# Patient Record
Sex: Female | Born: 1980 | Race: Black or African American | Hispanic: No | Marital: Single | Smoking: Former smoker
Health system: Southern US, Community
[De-identification: ages and names within clinical notes are randomized; demographics above are authoritative.]

## PROBLEM LIST (undated history)

## (undated) ENCOUNTER — Inpatient Hospital Stay (HOSPITAL_COMMUNITY): Payer: Self-pay

## (undated) DIAGNOSIS — J45909 Unspecified asthma, uncomplicated: Secondary | ICD-10-CM

## (undated) DIAGNOSIS — B009 Herpesviral infection, unspecified: Secondary | ICD-10-CM

## (undated) DIAGNOSIS — J40 Bronchitis, not specified as acute or chronic: Secondary | ICD-10-CM

---

## 2011-06-10 ENCOUNTER — Emergency Department (HOSPITAL_BASED_OUTPATIENT_CLINIC_OR_DEPARTMENT_OTHER)
Admission: EM | Admit: 2011-06-10 | Discharge: 2011-06-10 | Disposition: A | Discharge status: Home / Self Care | Payer: Self-pay | Attending: Emergency Medicine | Admitting: Emergency Medicine

## 2011-06-10 ENCOUNTER — Encounter: Payer: Self-pay | Admitting: *Deleted

## 2011-06-10 DIAGNOSIS — F17200 Nicotine dependence, unspecified, uncomplicated: Secondary | ICD-10-CM | POA: Insufficient documentation

## 2011-06-10 DIAGNOSIS — IMO0001 Reserved for inherently not codable concepts without codable children: Secondary | ICD-10-CM | POA: Insufficient documentation

## 2011-06-10 NOTE — ED Provider Notes (Signed)
Medical screening examination/treatment/procedure(s) were performed by non-physician practitioner and as supervising physician I was immediately available for consultation/collaboration.   Lyanne Co, MD 06/10/11 2203

## 2011-06-10 NOTE — ED Notes (Signed)
Pt c/o right hand and arm ? Insect bite x 2 days

## 2011-06-10 NOTE — ED Provider Notes (Signed)
History     CSN: [id number] Arrival date & time: 06/10/2011  1:42 PM  Chief Complaint  Patient presents with  . Insect Bite   Patient is a 30 y.o. female presenting with rash. The history is provided by the patient.  Rash  This is a new problem. The current episode started yesterday. The problem has not changed since onset.The problem is associated with an insect bite/sting. There has been no fever. The rash is present on the right arm. The pain is at a severity of 0/10. The patient is experiencing no pain. Associated symptoms include itching. She has tried nothing for the symptoms. Risk factors include new environmental exposures.   Pt complains of itching and bug bites on her left arm. History reviewed. No pertinent past medical history.  History reviewed. No pertinent past surgical history.  History reviewed. No pertinent family history.  History  Substance Use Topics  . Smoking status: Current Everyday Smoker -- 0.5 packs/day  . Smokeless tobacco: Not on file  . Alcohol Use: No    OB History    Grav Para Term Preterm Abortions TAB SAB Ect Mult Living                  Review of Systems  Skin: Positive for itching and rash.  All other systems reviewed and are negative.    Physical Exam  BP 104/55  Pulse 63  Temp(Src) 98 F (36.7 C) (Oral)  Resp 16  Wt 185 lb (83.915 kg)  LMP 06/03/2011  Physical Exam  Nursing note and vitals reviewed. Constitutional: She appears well-developed and well-nourished.  HENT:  Head: Normocephalic.  Eyes: Conjunctivae are normal. Pupils are equal, round, and reactive to light.  Neck: Normal range of motion. Neck supple.  Cardiovascular: Normal rate.   Pulmonary/Chest: Effort normal.  Abdominal: Soft.  Musculoskeletal: Normal range of motion.  Neurological: She is alert.  Skin: Rash noted.  Psychiatric: She has a normal mood and affect.   Pt has 2 small insect bites on her right arm ED Course  Procedures  MDM        Langston Masker, [place] 06/10/11 1410

## 2015-10-22 DIAGNOSIS — A6009 Herpesviral infection of other urogenital tract: Secondary | ICD-10-CM

## 2015-11-02 ENCOUNTER — Emergency Department (HOSPITAL_BASED_OUTPATIENT_CLINIC_OR_DEPARTMENT_OTHER)
Admission: EM | Admit: 2015-11-02 | Discharge: 2015-11-02 | Disposition: A | Discharge status: Home / Self Care | Payer: Medicaid Other | Attending: Emergency Medicine | Admitting: Emergency Medicine

## 2015-11-02 ENCOUNTER — Encounter (HOSPITAL_BASED_OUTPATIENT_CLINIC_OR_DEPARTMENT_OTHER): Payer: Self-pay | Admitting: *Deleted

## 2015-11-02 ENCOUNTER — Emergency Department (HOSPITAL_BASED_OUTPATIENT_CLINIC_OR_DEPARTMENT_OTHER): Payer: Medicaid Other

## 2015-11-02 DIAGNOSIS — R197 Diarrhea, unspecified: Secondary | ICD-10-CM | POA: Insufficient documentation

## 2015-11-02 DIAGNOSIS — Z8709 Personal history of other diseases of the respiratory system: Secondary | ICD-10-CM | POA: Diagnosis not present

## 2015-11-02 DIAGNOSIS — R55 Syncope and collapse: Secondary | ICD-10-CM | POA: Diagnosis not present

## 2015-11-02 DIAGNOSIS — R112 Nausea with vomiting, unspecified: Secondary | ICD-10-CM

## 2015-11-02 DIAGNOSIS — F17200 Nicotine dependence, unspecified, uncomplicated: Secondary | ICD-10-CM | POA: Diagnosis not present

## 2015-11-02 DIAGNOSIS — Z3202 Encounter for pregnancy test, result negative: Secondary | ICD-10-CM | POA: Diagnosis not present

## 2015-11-02 DIAGNOSIS — B349 Viral infection, unspecified: Secondary | ICD-10-CM | POA: Diagnosis not present

## 2015-11-02 HISTORY — DX: Bronchitis, not specified as acute or chronic: J40

## 2015-11-02 LAB — CBC WITH DIFFERENTIAL/PLATELET
BASOS ABS: 0 10*3/uL (ref 0.0–0.1)
BASOS PCT: 0 %
EOS ABS: 0 10*3/uL (ref 0.0–0.7)
Eosinophils Relative: 0 %
HCT: 40.5 % (ref 36.0–46.0)
HEMOGLOBIN: 13.1 g/dL (ref 12.0–15.0)
Lymphocytes Relative: 7 %
Lymphs Abs: 0.9 10*3/uL (ref 0.7–4.0)
MCH: 29.5 pg (ref 26.0–34.0)
MCHC: 32.3 g/dL (ref 30.0–36.0)
MCV: 91.2 fL (ref 78.0–100.0)
Monocytes Absolute: 0.8 10*3/uL (ref 0.1–1.0)
Monocytes Relative: 7 %
NEUTROS ABS: 10.1 10*3/uL — AB (ref 1.7–7.7)
NEUTROS PCT: 86 %
Platelets: 327 10*3/uL (ref 150–400)
RBC: 4.44 MIL/uL (ref 3.87–5.11)
RDW: 12 % (ref 11.5–15.5)
WBC: 11.8 10*3/uL — AB (ref 4.0–10.5)

## 2015-11-02 LAB — URINALYSIS, ROUTINE W REFLEX MICROSCOPIC
GLUCOSE, UA: NEGATIVE mg/dL
Hgb urine dipstick: NEGATIVE
KETONES UR: 15 mg/dL — AB
NITRITE: NEGATIVE
PH: 5.5 (ref 5.0–8.0)
PROTEIN: NEGATIVE mg/dL
Specific Gravity, Urine: 1.029 (ref 1.005–1.030)

## 2015-11-02 LAB — URINE MICROSCOPIC-ADD ON: RBC / HPF: NONE SEEN RBC/hpf (ref 0–5)

## 2015-11-02 LAB — COMPREHENSIVE METABOLIC PANEL
ALK PHOS: 56 U/L (ref 38–126)
ALT: 9 U/L — ABNORMAL LOW (ref 14–54)
ANION GAP: 7 (ref 5–15)
AST: 13 U/L — ABNORMAL LOW (ref 15–41)
Albumin: 4 g/dL (ref 3.5–5.0)
BILIRUBIN TOTAL: 0.9 mg/dL (ref 0.3–1.2)
BUN: 11 mg/dL (ref 6–20)
CALCIUM: 9.1 mg/dL (ref 8.9–10.3)
CO2: 26 mmol/L (ref 22–32)
Chloride: 105 mmol/L (ref 101–111)
Creatinine, Ser: 0.79 mg/dL (ref 0.44–1.00)
GFR calc non Af Amer: >60 mL/min (ref >60)
Glucose, Bld: 91 mg/dL (ref 65–99)
POTASSIUM: 3.9 mmol/L (ref 3.5–5.1)
Sodium: 138 mmol/L (ref 135–145)
TOTAL PROTEIN: 8.4 g/dL — AB (ref 6.5–8.1)

## 2015-11-02 LAB — LIPASE, BLOOD: LIPASE: 17 U/L (ref 11–51)

## 2015-11-02 LAB — PREGNANCY, URINE: Preg Test, Ur: NEGATIVE

## 2015-11-02 MED ORDER — ONDANSETRON HCL 4 MG/2ML IJ SOLN
4.0000 mg | Freq: Once | INTRAMUSCULAR | Status: AC
  Administered 2015-11-02: 4 mg via INTRAVENOUS
  Filled 2015-11-02: qty 2

## 2015-11-02 MED ORDER — SODIUM CHLORIDE 0.9 % IV BOLUS (SEPSIS)
1000.0000 mL | Freq: Once | INTRAVENOUS | Status: AC
  Administered 2015-11-02: 1000 mL via INTRAVENOUS

## 2015-11-02 MED ORDER — ONDANSETRON 4 MG PO TBDP: 4.0000 mg | ORAL_TABLET | Freq: Three times a day (TID) | ORAL | Status: DC | PRN

## 2015-11-02 NOTE — ED Notes (Signed)
PA at bedside.

## 2015-11-02 NOTE — ED Notes (Signed)
Pt states she was here with children a week or so ago and they were diagnosed with URI. Today had near syncopal episode at work. Diarrhea onset this a.m x 2 and vomited x 3.. Last time at 1300. Also c/o cough. Hurts to cough. Abd discomfort after having dry heaves. Denies urinary s/s. Clr vag d/c.

## 2015-11-02 NOTE — ED Notes (Signed)
Reviewed d/c instructions with pt. Verbalizes understanding. No questions. Accompanied to d/c window.

## 2015-11-02 NOTE — ED Notes (Signed)
EMS VS BP 120/80 P 88 SPO2 98% R 16

## 2015-11-02 NOTE — ED Notes (Signed)
Patient transported to and from radiology department via stretcher. 

## 2015-11-02 NOTE — ED Provider Notes (Signed)
CSN: [id number]     Arrival date & time 11/02/15  1512 History   First MD Initiated Contact with Patient 11/02/15 1516     Chief Complaint  Patient presents with  . Nausea    Pt reports vomiting X 3 today    HPI  Ms. Dorise BullionGladden is an 35 y.o. female with no significant PMH who presents to the ED for evaluation of nausea, vomiting, and diarrhea with a pre-syncopal episode. She states that last week her daughter was sick with a URI and since then she has had a mild intermittent cough. However starting this AM pt reports she has felt nauseated and fatigued. She reports three episodes of NBNB emesis and 2 episodes of watery diarrhea. 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 She states she did not lose consciousness completely. In the ED now she denies headache, chest pain, SOB, dizziness. She states she feels nauseated but has had no further episodes of emesis. She is unsure if she has had a fever at home. Denies recent travel.   Past Medical History  Diagnosis Date  . Bronchitis    History reviewed. No pertinent past surgical history. No family history on file. Social History  Substance Use Topics  . Smoking status: Current Every Day Smoker -- 0.50 packs/day  . Smokeless tobacco: None  . Alcohol Use: No   OB History    No data available     Review of Systems  All other systems reviewed and are negative.     Allergies  Review of patient's allergies indicates no known allergies.  Home Medications   Prior to Admission medications   Not on File   BP 116/71 mmHg  Pulse 86  Temp(Src) 99 F (37.2 C) (Oral)  Resp 18  Ht 5' (1.524 m)  Wt 81.647 kg  BMI 35.15 kg/m2  SpO2 100%  LMP 10/02/2015 Physical Exam  Constitutional: She is oriented to person, place, and time. No distress.  HENT:  Right Ear: External ear normal.  Left Ear: External ear normal.  Nose: Nose  normal.  Mouth/Throat: Oropharynx is clear and moist. No oropharyngeal exudate.  Eyes: Conjunctivae and EOM are normal. Pupils are equal, round, and reactive to light.  Neck: Normal range of motion. Neck supple.  Cardiovascular: Normal rate, regular rhythm, normal heart sounds and intact distal pulses.   Pulmonary/Chest: Effort normal and breath sounds normal. No respiratory distress. She has no wheezes. She exhibits no tenderness.  Abdominal: Soft. Bowel sounds are normal. She exhibits no distension. There is no tenderness. There is no rebound and no guarding.  Musculoskeletal: She exhibits no edema.  Neurological: She is alert and oriented to person, place, and time. No cranial nerve deficit.  Skin: Skin is warm and dry. She is not diaphoretic.  Psychiatric: She has a normal mood and affect.  Nursing note and vitals reviewed.   Orthostatic VS for the past 24 hrs:  BP- Lying Pulse- Lying BP- Sitting Pulse- Sitting BP- Standing at 0 minutes Pulse- Standing at 0 minutes  11/02/15 1623 112/69 mmHg 72 120/82 mmHg 72 104/72 mmHg 99      ED Course  Procedures (including critical care time) Labs Review Labs Reviewed  URINALYSIS, ROUTINE W REFLEX MICROSCOPIC (NOT AT [facility]) - Abnormal; Notable for the following:    Color, Urine AMBER (*)    Bilirubin Urine SMALL (*)  Ketones, ur 15 (*)    Leukocytes, UA TRACE (*)    All other components within normal limits  COMPREHENSIVE METABOLIC PANEL - Abnormal; Notable for the following:    Total Protein 8.4 (*)    AST 13 (*)    ALT 9 (*)    All other components within normal limits  CBC WITH DIFFERENTIAL/PLATELET - Abnormal; Notable for the following:    WBC 11.8 (*)    Neutro Abs 10.1 (*)    All other components within normal limits  URINE MICROSCOPIC-ADD ON - Abnormal; Notable for the following:    Squamous Epithelial / LPF 0-5 (*)    Bacteria, UA RARE (*)    All other components within normal limits  PREGNANCY, URINE  LIPASE, BLOOD     Imaging Review Dg Chest 2 View  11/02/2015  CLINICAL DATA:  Syncope and cough for 1 day. Epigastric pain, nausea and vomiting. Initial encounter. EXAM: CHEST  2 VIEW COMPARISON:  None. FINDINGS: The lungs are clear. Heart size is normal. No pneumothorax or pleural fluid. No bony abnormality. IMPRESSION: Normal chest. Electronically Signed   By: Drusilla Kanner M.D.   On: 11/02/2015 16:11   I have personally reviewed and evaluated these images and lab results as part of my medical decision-making.   EKG Interpretation   Date/Time:  Thursday November 02 2015 16:11:02 EST Ventricular Rate:  72 PR Interval:  145 QRS Duration: 82 QT Interval:  365 QTC Calculation: 399 R Axis:   78 Text Interpretation:  Sinus rhythm Borderline T abnormalities, anterior  leads ED PHYSICIAN INTERPRETATION AVAILABLE IN [facility] Confirmed  by TEST, Record ([id number]) on 11/03/2015 6:46:27 AM      MDM   Final diagnoses:  Non-intractable vomiting with nausea, vomiting of unspecified type  Viral syndrome    Suspect pt has viral syndrome with pre-syncopal episode due to orthostasis and dehydration. Her UA does show trace leuks but she denies urinary symptoms so will just send for culture for now. CXR negative. WBC 11.8. Pregnancy negative. Pt feels improved with 1L NS bolus and zofran in the ED. Will give rx for zofran at home and pt instructed to drink plenty of fluids to stay hydrated. Resource guide given for PCP follow up. ER return precautions given.    Carlene Coria, PA-C 11/03/15 1824  Nelva Nay, MD 11/08/15 [phone]

## 2015-11-02 NOTE — Discharge Instructions (Signed)
Your labs today were normal. Your symptoms are likely due to a virus. I will give you a prescription for medicine to help with your nausea. Drink plenty of fluids. Stick to a simple diet until your symptoms improve. Please follow up with your primary care provider. If you do not have one you may contact one on the list we give you with this packet.

## 2016-02-17 ENCOUNTER — Encounter (HOSPITAL_BASED_OUTPATIENT_CLINIC_OR_DEPARTMENT_OTHER): Payer: Self-pay | Admitting: *Deleted

## 2016-02-17 ENCOUNTER — Emergency Department (HOSPITAL_BASED_OUTPATIENT_CLINIC_OR_DEPARTMENT_OTHER)
Admission: EM | Admit: 2016-02-17 | Discharge: 2016-02-17 | Disposition: A | Discharge status: Home / Self Care | Payer: Medicaid Other | Attending: Emergency Medicine | Admitting: Emergency Medicine

## 2016-02-17 DIAGNOSIS — O2341 Unspecified infection of urinary tract in pregnancy, first trimester: Secondary | ICD-10-CM | POA: Diagnosis not present

## 2016-02-17 DIAGNOSIS — Z87891 Personal history of nicotine dependence: Secondary | ICD-10-CM | POA: Diagnosis not present

## 2016-02-17 DIAGNOSIS — Z3A10 10 weeks gestation of pregnancy: Secondary | ICD-10-CM | POA: Insufficient documentation

## 2016-02-17 DIAGNOSIS — N390 Urinary tract infection, site not specified: Secondary | ICD-10-CM

## 2016-02-17 DIAGNOSIS — O210 Mild hyperemesis gravidarum: Secondary | ICD-10-CM | POA: Diagnosis present

## 2016-02-17 DIAGNOSIS — O219 Vomiting of pregnancy, unspecified: Secondary | ICD-10-CM

## 2016-02-17 LAB — URINALYSIS, ROUTINE W REFLEX MICROSCOPIC
Glucose, UA: NEGATIVE mg/dL
Hgb urine dipstick: NEGATIVE
Ketones, ur: >80 mg/dL — AB
NITRITE: NEGATIVE
PROTEIN: 30 mg/dL — AB
SPECIFIC GRAVITY, URINE: 1.036 — AB (ref 1.005–1.030)
pH: 6.5 (ref 5.0–8.0)

## 2016-02-17 LAB — URINE MICROSCOPIC-ADD ON

## 2016-02-17 LAB — PREGNANCY, URINE: PREG TEST UR: POSITIVE — AB

## 2016-02-17 MED ORDER — ONDANSETRON 4 MG PO TBDP
4.0000 mg | ORAL_TABLET | Freq: Once | ORAL | Status: AC
  Administered 2016-02-17: 4 mg via ORAL

## 2016-02-17 MED ORDER — ONDANSETRON 4 MG PO TBDP
ORAL_TABLET | ORAL | Status: AC
  Filled 2016-02-17: qty 1

## 2016-02-17 MED ORDER — CEPHALEXIN 500 MG PO CAPS: 500.0000 mg | ORAL_CAPSULE | Freq: Two times a day (BID) | ORAL | Status: DC

## 2016-02-17 MED ORDER — METOCLOPRAMIDE HCL 10 MG PO TABS: 10.0000 mg | ORAL_TABLET | Freq: Three times a day (TID) | ORAL | Status: DC | PRN

## 2016-02-17 NOTE — Discharge Instructions (Signed)
Start taking prenatal vitamins, available over the counter.     Urinary Tract Infection Urinary tract infections (UTIs) can develop anywhere along your urinary tract. Your urinary tract is your body's drainage system for removing wastes and extra water. Your urinary tract includes two kidneys, two ureters, a bladder, and a urethra. Your kidneys are a pair of bean-shaped organs. Each kidney is about the size of your fist. They are located below your ribs, one on each side of your spine. CAUSES Infections are caused by microbes, which are microscopic organisms, including fungi, viruses, and bacteria. These organisms are so small that they can only be seen through a microscope. Bacteria are the microbes that most commonly cause UTIs. SYMPTOMS  Symptoms of UTIs may vary by age and gender of the patient and by the location of the infection. Symptoms in young women typically include a frequent and intense urge to urinate and a painful, burning feeling in the bladder or urethra during urination. Older women and men are more likely to be tired, shaky, and weak and have muscle aches and abdominal pain. A fever may mean the infection is in your kidneys. Other symptoms of a kidney infection include pain in your back or sides below the ribs, nausea, and vomiting. DIAGNOSIS To diagnose a UTI, your caregiver will ask you about your symptoms. Your caregiver will also ask you to provide a urine sample. The urine sample will be tested for bacteria and white blood cells. White blood cells are made by your body to help fight infection. TREATMENT  Typically, UTIs can be treated with medication. Because most UTIs are caused by a bacterial infection, they usually can be treated with the use of antibiotics. The choice of antibiotic and length of treatment depend on your symptoms and the type of bacteria causing your infection. HOME CARE INSTRUCTIONS  If you were prescribed antibiotics, take them exactly as your caregiver  instructs you. Finish the medication even if you feel better after you have only taken some of the medication.  Drink enough water and fluids to keep your urine clear or pale yellow.  Avoid caffeine, tea, and carbonated beverages. They tend to irritate your bladder.  Empty your bladder often. Avoid holding urine for long periods of time.  Empty your bladder before and after sexual intercourse.  After a bowel movement, women should cleanse from front to back. Use each tissue only once. SEEK MEDICAL CARE IF:   You have back pain.  You develop a fever.  Your symptoms do not begin to resolve within 3 days. SEEK IMMEDIATE MEDICAL CARE IF:   You have severe back pain or lower abdominal pain.  You develop chills.  You have nausea or vomiting.  You have continued burning or discomfort with urination. MAKE SURE YOU:   Understand these instructions.  Will watch your condition.  Will get help right away if you are not doing well or get worse.   This information is not intended to replace advice given to you by your health care provider. Make sure you discuss any questions you have with your health care provider.   Document Released: 07/17/2005 Document Revised: 06/28/2015 Document Reviewed: 11/15/2011 Elsevier Interactive Patient Education 2016 [employer]. First Trimester of Pregnancy The first trimester of pregnancy is from week 1 until the end of week 12 (months 1 through 3). A week after a sperm fertilizes an egg, the egg will implant on the wall of the uterus. This embryo will begin to develop into a  baby. Genes from you and your partner are forming the baby. The female genes determine whether the baby is a boy or a girl. At 6-8 weeks, the eyes and face are formed, and the heartbeat can be seen on ultrasound. At the end of 12 weeks, all the baby's organs are formed.  Now that you are pregnant, you will want to do everything you can to have a healthy baby. Two of the most important  things are to get good prenatal care and to follow your health care provider's instructions. Prenatal care is all the medical care you receive before the baby's birth. This care will help prevent, find, and treat any problems during the pregnancy and childbirth. BODY CHANGES Your body goes through many changes during pregnancy. The changes vary from woman to woman.   You may gain or lose a couple of pounds at first.  You may feel sick to your stomach (nauseous) and throw up (vomit). If the vomiting is uncontrollable, call your health care provider.  You may tire easily.  You may develop headaches that can be relieved by medicines approved by your health care provider.  You may urinate more often. Painful urination may mean you have a bladder infection.  You may develop heartburn as a result of your pregnancy.  You may develop constipation because certain hormones are causing the muscles that push waste through your intestines to slow down.  You may develop hemorrhoids or swollen, bulging veins (varicose veins).  Your breasts may begin to grow larger and become tender. Your nipples may stick out more, and the tissue that surrounds them (areola) may become darker.  Your gums may bleed and may be sensitive to brushing and flossing.  Dark spots or blotches (chloasma, mask of pregnancy) may develop on your face. This will likely fade after the baby is born.  Your menstrual periods will stop.  You may have a loss of appetite.  You may develop cravings for certain kinds of food.  You may have changes in your emotions from day to day, such as being excited to be pregnant or being concerned that something may go wrong with the pregnancy and baby.  You may have more vivid and strange dreams.  You may have changes in your hair. These can include thickening of your hair, rapid growth, and changes in texture. Some women also have hair loss during or after pregnancy, or hair that feels dry or  thin. Your hair will most likely return to normal after your baby is born. WHAT TO EXPECT AT YOUR PRENATAL VISITS During a routine prenatal visit:  You will be weighed to make sure you and the baby are growing normally.  Your blood pressure will be taken.  Your abdomen will be measured to track your baby's growth.  The fetal heartbeat will be listened to starting around week 10 or 12 of your pregnancy.  Test results from any previous visits will be discussed. Your health care provider may ask you:  How you are feeling.  If you are feeling the baby move.  If you have had any abnormal symptoms, such as leaking fluid, bleeding, severe headaches, or abdominal cramping.  If you are using any tobacco products, including cigarettes, chewing tobacco, and electronic cigarettes.  If you have any questions. Other tests that may be performed during your first trimester include:  Blood tests to find your blood type and to check for the presence of any previous infections. They will also be used to  check for low iron levels (anemia) and Rh antibodies. Later in the pregnancy, blood tests for diabetes will be done along with other tests if problems develop.  Urine tests to check for infections, diabetes, or protein in the urine.  An ultrasound to confirm the proper growth and development of the baby.  An amniocentesis to check for possible genetic problems.  Fetal screens for spina bifida and Down syndrome.  You may need other tests to make sure you and the baby are doing well.  HIV (human immunodeficiency virus) testing. Routine prenatal testing includes screening for HIV, unless you choose not to have this test. HOME CARE INSTRUCTIONS  Medicines  Follow your health care provider's instructions regarding medicine use. Specific medicines may be either safe or unsafe to take during pregnancy.  Take your prenatal vitamins as directed.  If you develop constipation, try taking a stool  softener if your health care provider approves. Diet  Eat regular, well-balanced meals. Choose a variety of foods, such as meat or vegetable-based protein, fish, milk and low-fat dairy products, vegetables, fruits, and whole grain breads and cereals. Your health care provider will help you determine the amount of weight gain that is right for you.  Avoid raw meat and uncooked cheese. These carry germs that can cause birth defects in the baby.  Eating four or five small meals rather than three large meals a day may help relieve nausea and vomiting. If you start to feel nauseous, eating a few soda crackers can be helpful. Drinking liquids between meals instead of during meals also seems to help nausea and vomiting.  If you develop constipation, eat more high-fiber foods, such as fresh vegetables or fruit and whole grains. Drink enough fluids to keep your urine clear or pale yellow. Activity and Exercise  Exercise only as directed by your health care provider. Exercising will help you:  Control your weight.  Stay in shape.  Be prepared for labor and delivery.  Experiencing pain or cramping in the lower abdomen or low back is a good sign that you should stop exercising. Check with your health care provider before continuing normal exercises.  Try to avoid standing for long periods of time. Move your legs often if you must stand in one place for a long time.  Avoid heavy lifting.  Wear low-heeled shoes, and practice good posture.  You may continue to have sex unless your health care provider directs you otherwise. Relief of Pain or Discomfort  Wear a good support bra for breast tenderness.   Take warm sitz baths to soothe any pain or discomfort caused by hemorrhoids. Use hemorrhoid cream if your health care provider approves.   Rest with your legs elevated if you have leg cramps or low back pain.  If you develop varicose veins in your legs, wear support hose. Elevate your feet for  15 minutes, 3-4 times a day. Limit salt in your diet. Prenatal Care  Schedule your prenatal visits by the twelfth week of pregnancy. They are usually scheduled monthly at first, then more often in the last 2 months before delivery.  Write down your questions. Take them to your prenatal visits.  Keep all your prenatal visits as directed by your health care provider. Safety  Wear your seat belt at all times when driving.  Make a list of emergency phone numbers, including numbers for family, friends, the hospital, and police and fire departments. General Tips  Ask your health care provider for a referral to a local  prenatal education class. Begin classes no later than at the beginning of month 6 of your pregnancy.  Ask for help if you have counseling or nutritional needs during pregnancy. Your health care provider can offer advice or refer you to specialists for help with various needs.  Do not use hot tubs, steam rooms, or saunas.  Do not douche or use tampons or scented sanitary pads.  Do not cross your legs for long periods of time.  Avoid cat litter boxes and soil used by cats. These carry germs that can cause birth defects in the baby and possibly loss of the fetus by miscarriage or stillbirth.  Avoid all smoking, herbs, alcohol, and medicines not prescribed by your health care provider. Chemicals in these affect the formation and growth of the baby.  Do not use any tobacco products, including cigarettes, chewing tobacco, and electronic cigarettes. If you need help quitting, ask your health care provider. You may receive counseling support and other resources to help you quit.  Schedule a dentist appointment. At home, brush your teeth with a soft toothbrush and be gentle when you floss. SEEK MEDICAL CARE IF:   You have dizziness.  You have mild pelvic cramps, pelvic pressure, or nagging pain in the abdominal area.  You have persistent nausea, vomiting, or diarrhea.  You have  a bad smelling vaginal discharge.  You have pain with urination.  You notice increased swelling in your face, hands, legs, or ankles. SEEK IMMEDIATE MEDICAL CARE IF:   You have a fever.  You are leaking fluid from your vagina.  You have spotting or bleeding from your vagina.  You have severe abdominal cramping or pain.  You have rapid weight gain or loss.  You vomit blood or material that looks like coffee grounds.  You are exposed to [place] measles and have never had them.  You are exposed to fifth disease or chickenpox.  You develop a severe headache.  You have shortness of breath.  You have any kind of trauma, such as from a fall or a car accident.   This information is not intended to replace advice given to you by your health care provider. Make sure you discuss any questions you have with your health care provider.   Document Released: 10/01/2001 Document Revised: 10/28/2014 Document Reviewed: 08/17/2013 Elsevier Interactive Patient Education [employer]

## 2016-02-17 NOTE — ED Provider Notes (Signed)
CSN: [id number]     Arrival date & time 02/17/16  1319 History  By signing my name below, I, Bethel Born, attest that this documentation has been prepared under the direction and in the presence of Tilden Fossa, MD. Electronically Signed: Bethel Born, ED Scribe. 02/17/2016. 3:14 PM   Chief Complaint  Patient presents with  . Emesis    The history is provided by the patient. No language interpreter was used.   Cassandra Wade is a 35 y.o. female who presents to the Emergency Department complaining of nausea and vomiting with onset 3 days ago. Pt states that her appetite has decreased and she has been having daily nausea and yellow emesis. The episodes of emesis increased last night. She has been able to hold down fluids. Associated symptoms include clear vaginal discharge. Pt is concerned that she may be pregnant. LNMP was in February of this year. G2P2.  Pt denies fever, abdominal pain, and diarrhea. She is otherwise healthy and takes no daily medication. NKDA.   Past Medical History  Diagnosis Date  . Bronchitis    History reviewed. No pertinent past surgical history. No family history on file. Social History  Substance Use Topics  . Smoking status: Former Smoker -- 0.50 packs/day  . Smokeless tobacco: Never Used  . Alcohol Use: No   OB History    No data available     Review of Systems  Constitutional: Positive for appetite change. Negative for fever.  Gastrointestinal: Positive for nausea and vomiting. Negative for abdominal pain and diarrhea.  All other systems reviewed and are negative.   Allergies  Review of patient's allergies indicates no known allergies.  Home Medications   Prior to Admission medications   Medication Sig Start Date End Date Taking? Authorizing Provider  cephALEXin (KEFLEX) 500 MG capsule Take 1 capsule (500 mg total) by mouth 2 (two) times daily. 02/17/16   Tilden Fossa, MD  metoCLOPramide (REGLAN) 10 MG tablet Take 1 tablet (10 mg  total) by mouth every 8 (eight) hours as needed for nausea. 02/17/16   Tilden Fossa, MD  ondansetron (ZOFRAN ODT) 4 MG disintegrating tablet Take 1 tablet (4 mg total) by mouth every 8 (eight) hours as needed for nausea or vomiting. 11/02/15   Ace Gins Sam, PA-C   BP 111/65 mmHg  Pulse 66  Temp(Src) 98.2 F (36.8 C) (Oral)  Resp 20  Ht  (1.6 m)  Wt 144 lb (65.318 kg)  BMI 25.51 kg/m2  SpO2 100%  LMP 12/15/2015 (Approximate) Physical Exam  Constitutional: She is oriented to person, place, and time. She appears well-developed and well-nourished.  HENT:  Head: Normocephalic and atraumatic.  Cardiovascular: Normal rate and regular rhythm.   No murmur heard. Pulmonary/Chest: Effort normal and breath sounds normal. No respiratory distress.  Abdominal: Soft. There is no tenderness. There is no rebound and no guarding.  Musculoskeletal: She exhibits no edema or tenderness.  Neurological: She is alert and oriented to person, place, and time.  Skin: Skin is warm and dry.  Psychiatric: She has a normal mood and affect. Her behavior is normal.  Nursing note and vitals reviewed.   ED Course  Procedures  EMERGENCY DEPARTMENT [place] PREGNANCY "Study: Limited Ultrasound of the Pelvis"  INDICATIONS:Teaching study Multiple views of the uterus and pelvic cavity are obtained with a multi-frequency probe.  APPROACH:Transabdominal   PERFORMED BY: Myself  IMAGES ARCHIVED?: Yes  LIMITATIONS: Decompressed bladder  PREGNANCY UTERUS FINDINGS:Gestational sac noted   PREGNANCY FINDINGS: Fetal heart activity  seen  INTERPRETATION: Viable intrauterine pregnancy   FETAL HEART RATE: 167     DIAGNOSTIC STUDIES: Oxygen Saturation is 100% on RA,  normal by my interpretation.    COORDINATION OF CARE: 3:10 PM Discussed treatment plan which includes lab work and Zofran with pt at bedside and pt agreed to plan.  Labs Review Labs Reviewed  URINALYSIS, ROUTINE W REFLEX MICROSCOPIC (NOT [facility]) - Abnormal; Notable for the following:    Color, Urine AMBER (*)    APPearance CLOUDY (*)    Specific Gravity, Urine 1.036 (*)    Bilirubin Urine MODERATE (*)    Ketones, ur >80 (*)    Protein, ur 30 (*)    Leukocytes, UA MODERATE (*)    All other components within normal limits  PREGNANCY, URINE - Abnormal; Notable for the following:    Preg Test, Ur POSITIVE (*)    All other components within normal limits  URINE MICROSCOPIC-ADD ON - Abnormal; Notable for the following:    Squamous Epithelial / LPF 6-30 (*)    Bacteria, UA MANY (*)    All other components within normal limits    Imaging Review No results found. I have personally reviewed and evaluated these lab results as part of my medical decision-making.   EKG Interpretation None      MDM   Final diagnoses:  Nausea/vomiting in pregnancy  Acute UTI (urinary tract infection)   Pt here with vomiting, concerned she may be pregnant.  Bedside US with cardiac activity, IUP.  UA is c/w UTI.  Pt tolerating oral fluids in the department.  Discussed home care for first trimester pregnancy, UTI, vomiting.  Discussed outpatient follow up and return precautions.    I personally performed the services described in this documentation, which was scribed in my presence. The recorded information has been reviewed and is accurate.    Tilden FossaElizabeth Pallavi Clifton, MD 02/17/16 1743

## 2016-02-17 NOTE — ED Notes (Signed)
N/v since wednesday

## 2016-02-17 NOTE — ED Notes (Signed)
Discharged in error

## 2016-03-22 ENCOUNTER — Emergency Department (HOSPITAL_BASED_OUTPATIENT_CLINIC_OR_DEPARTMENT_OTHER)
Admission: EM | Admit: 2016-03-22 | Discharge: 2016-03-23 | Disposition: A | Discharge status: Home / Self Care | Payer: Medicaid Other | Attending: Emergency Medicine | Admitting: Emergency Medicine

## 2016-03-22 ENCOUNTER — Encounter (HOSPITAL_BASED_OUTPATIENT_CLINIC_OR_DEPARTMENT_OTHER): Payer: Self-pay

## 2016-03-22 DIAGNOSIS — Z3A14 14 weeks gestation of pregnancy: Secondary | ICD-10-CM | POA: Diagnosis not present

## 2016-03-22 DIAGNOSIS — Z87891 Personal history of nicotine dependence: Secondary | ICD-10-CM | POA: Insufficient documentation

## 2016-03-22 DIAGNOSIS — O210 Mild hyperemesis gravidarum: Secondary | ICD-10-CM | POA: Diagnosis not present

## 2016-03-22 DIAGNOSIS — O219 Vomiting of pregnancy, unspecified: Secondary | ICD-10-CM

## 2016-03-22 MED ORDER — METOCLOPRAMIDE HCL 10 MG PO TABS
10.0000 mg | ORAL_TABLET | Freq: Once | ORAL | Status: AC
  Administered 2016-03-22: 10 mg via ORAL
  Filled 2016-03-22: qty 1

## 2016-03-22 NOTE — ED Notes (Signed)
Pt is pregnant and c/o n/v "for a while," states it's been going on "all day, non stop."  Pt in no acute distress.  Pt has several medications at home for n/v but states they are not working.

## 2016-03-22 NOTE — ED Provider Notes (Signed)
CSN: [id number]     Arrival date & time 03/22/16  2204 History  By signing my name below, I, Bridgette HabermannMaria Tan, attest that this documentation has been prepared under the direction and in the presence of Tomasita CrumbleAdeleke Linsey Arteaga, MD. Electronically Signed: Bridgette HabermannMaria Tan, ED Scribe. 03/22/2016. 11:25 PM.   Chief Complaint  Patient presents with  . Emesis During Pregnancy    The history is provided by the patient. No language interpreter was used.    HPI Comments: Cassandra Wade is a 35 y.o. female who presents to the Emergency Department complaining of nausea and vomiting onset a couple of weeks. Pt is currently pregnant. Patient also has abdominal pain secondary to vomiting. Patient states that she has had these symptoms in her past pregnancy but not this bad. Patient has been taking Diclegis, Phenergan, and Pepcid with no relief. Patient denies fever, diarrhea, diaphoresis, vaginal bleeding, fluid leakage. Pt saw her OBGYN on 05/30 (approximately 3 days ago) for her symptoms and was prescribed the medication above.   Past Medical History  Diagnosis Date  . Bronchitis    History reviewed. No pertinent past surgical history. No family history on file. Social History  Substance Use Topics  . Smoking status: Former Smoker -- 0.50 packs/day  . Smokeless tobacco: Never Used  . Alcohol Use: No   OB History    Gravida Para Term Preterm AB TAB SAB Ectopic Multiple Living   1              Review of Systems  10 Systems reviewed and are negative for acute change except as noted in the HPI.  Allergies  Review of patient's allergies indicates no known allergies.  Home Medications   Prior to Admission medications   Medication Sig Start Date End Date Taking? Authorizing Provider  cephALEXin (KEFLEX) 500 MG capsule Take 1 capsule (500 mg total) by mouth 2 (two) times daily. 02/17/16   Tilden FossaElizabeth Rees, MD  metoCLOPramide (REGLAN) 10 MG tablet Take 1 tablet (10 mg total) by mouth every 8 (eight) hours as needed for  nausea. 02/17/16   Tilden FossaElizabeth Rees, MD  ondansetron (ZOFRAN ODT) 4 MG disintegrating tablet Take 1 tablet (4 mg total) by mouth every 8 (eight) hours as needed for nausea or vomiting. 11/02/15   Ace GinsSerena Y Sam, PA-C   BP 120/57 mmHg  Pulse 78  Temp(Src) 98.6 F (37 C) (Oral)  Resp 20  Ht 5' (1.524 m)  Wt 152 lb (68.947 kg)  BMI 29.69 kg/m2  SpO2 100%  LMP 12/15/2015 (Approximate) Physical Exam  Constitutional: She is oriented to person, place, and time. She appears well-developed and well-nourished. No distress.  HENT:  Head: Normocephalic and atraumatic.  Nose: Nose normal.  Mouth/Throat: Oropharynx is clear and moist. No oropharyngeal exudate.  Eyes: Conjunctivae and EOM are normal. Pupils are equal, round, and reactive to light. No scleral icterus.  Neck: Normal range of motion. Neck supple. No JVD present. No tracheal deviation present. No thyromegaly present.  Cardiovascular: Normal rate, regular rhythm and normal heart sounds.  Exam reveals no gallop and no friction rub.   No murmur heard. Pulmonary/Chest: Effort normal and breath sounds normal. No respiratory distress. She has no wheezes. She exhibits no tenderness.  Abdominal: Bowel sounds are normal. She exhibits no distension and no mass. There is no tenderness. There is no rebound and no guarding.  Gravid uterus  Musculoskeletal: Normal range of motion. She exhibits no edema or tenderness.  Lymphadenopathy:    She has no  cervical adenopathy.  Neurological: She is alert and oriented to person, place, and time. No cranial nerve deficit. She exhibits normal muscle tone.  Skin: Skin is warm and dry. No rash noted. No erythema. No pallor.  Nursing note and vitals reviewed.   ED Course  Procedures  DIAGNOSTIC STUDIES: Oxygen Saturation is 100% on RA, normal by my interpretation.    COORDINATION OF CARE: 11:13 PM Discussed treatment plan with pt at bedside which includes Reglan and pt agreed to plan.    MDM   Final  diagnoses:  None    Patient presents to the ED for N/V in the setting of pregnancy.  She was recently checked a couple of days ago with her OB and had a normal evaluation.  No indication for repeat evaluation here. She was given reglan for treatment and states her symptoms have completely resolved.  Will DC with reglan to take as needed.  She appears well and in NAD. VS remain within her normal limits and she is safe for DC.  I personally performed the services described in this documentation, which was scribed in my presence. The recorded information has been reviewed and is accurate.      Tomasita Crumble, MD 03/23/16 [phone]

## 2016-03-23 MED ORDER — METOCLOPRAMIDE HCL 10 MG PO TABS
10.0000 mg | ORAL_TABLET | Freq: Three times a day (TID) | ORAL | Status: DC | PRN
Start: 2016-03-23 — End: 2019-09-04

## 2016-03-23 NOTE — Discharge Instructions (Signed)
Hyperemesis Gravidarum Cassandra Wade, take reglan as needed for nausea and see your OB physician within 3 days for close follow up. If symptoms worsen, come back to the ED immediately. Thank you. Hyperemesis gravidarum is a severe form of nausea and vomiting that happens during pregnancy. Hyperemesis is worse than morning sickness. It may cause you to have nausea or vomiting all day for many days. It may keep you from eating and drinking enough food and liquids. Hyperemesis usually occurs during the first half (the first 20 weeks) of pregnancy. It often goes away once a woman is in her second half of pregnancy. However, sometimes hyperemesis continues through an entire pregnancy.  CAUSES  The cause of this condition is not completely known but is thought to be related to changes in the body's hormones when pregnant. It could be from the high level of the pregnancy hormone or an increase in estrogen in the body.  SIGNS AND SYMPTOMS   Severe nausea and vomiting.  Nausea that does not go away.  Vomiting that does not allow you to keep any food down.  Weight loss and body fluid loss (dehydration).  Having no desire to eat or not liking food you have previously enjoyed. DIAGNOSIS  Your health care provider will do a physical exam and ask you about your symptoms. He or she may also order blood tests and urine tests to make sure something else is not causing the problem.  TREATMENT  You may only need medicine to control the problem. If medicines do not control the nausea and vomiting, you will be treated in the hospital to prevent dehydration, increased acid in the blood (acidosis), weight loss, and changes in the electrolytes in your body that may harm the unborn baby (fetus). You may need IV fluids.  HOME CARE INSTRUCTIONS   Only take over-the-counter or prescription medicines as directed by your health care provider.  Try eating a couple of dry crackers or toast in the morning before getting out of  bed.  Avoid foods and smells that upset your stomach.  Avoid fatty and spicy foods.  Eat 5-6 small meals a day.  Do not drink when eating meals. Drink between meals.  For snacks, eat high-protein foods, such as cheese.  Eat or suck on things that have ginger in them. Ginger helps nausea.  Avoid food preparation. The smell of food can spoil your appetite.  Avoid iron pills and iron in your multivitamins until after 3-4 months of being pregnant. However, consult with your health care provider before stopping any prescribed iron pills. SEEK MEDICAL CARE IF:   Your abdominal pain increases.  You have a severe headache.  You have vision problems.  You are losing weight. SEEK IMMEDIATE MEDICAL CARE IF:   You are unable to keep fluids down.  You vomit blood.  You have constant nausea and vomiting.  You have excessive weakness.  You have extreme thirst.  You have dizziness or fainting.  You have a fever or persistent symptoms for more than 2-3 days.  You have a fever and your symptoms suddenly get worse. MAKE SURE YOU:   Understand these instructions.  Will watch your condition.  Will get help right away if you are not doing well or get worse.   This information is not intended to replace advice given to you by your health care provider. Make sure you discuss any questions you have with your health care provider.   Document Released: 10/07/2005 Document Revised: 07/28/2013 Document  Reviewed: 05/19/2013 Elsevier Interactive Patient Education 2016 [employer] How a Baby Grows During Pregnancy Pregnancy begins when a female's sperm enters a female's egg (fertilization). This happens in one of the tubes (fallopian tubes) that connect the ovaries to the womb (uterus). The fertilized egg is called an embryo until it reaches 10 weeks. From 10 weeks until birth, it is called a fetus. The fertilized egg moves down the fallopian tube to the uterus. Then it implants into the  lining of the uterus and begins to grow. The developing fetus receives oxygen and nutrients through the pregnant woman's bloodstream and the tissues that grow (placenta) to support the fetus. The placenta is the life support system for the fetus. It provides nutrition and removes waste. Learning as much as you can about your pregnancy and how your baby is developing can help you enjoy the experience. It can also make you aware of when there might be a problem and when to ask questions. HOW LONG DOES A TYPICAL PREGNANCY LAST? A pregnancy usually lasts 280 days, or about 40 weeks. Pregnancy is divided into three trimesters:  First trimester: 0-13 weeks.  Second trimester: 14-27 weeks.  Third trimester: 28-40 weeks. The day when your baby is considered ready to be born (full term) is your estimated date of delivery. HOW DOES MY BABY DEVELOP MONTH BY MONTH? First month  The fertilized egg attaches to the inside of the uterus.  Some cells will form the placenta. Others will form the fetus.  The arms, legs, brain, spinal cord, lungs, and heart begin to develop.  At the end of the first month, the heart begins to beat. Second month  The bones, inner ear, eyelids, hands, and feet form.  The genitals develop.  By the end of 8 weeks, all major organs are developing. Third month  All of the internal organs are forming.  Teeth develop below the gums.  Bones and muscles begin to grow. The spine can flex.  The skin is transparent.  Fingernails and toenails begin to form.  Arms and legs continue to grow longer, and hands and feet develop.  The fetus is about 3 in (7.6 cm) long. Fourth month  The placenta is completely formed.  The external sex organs, neck, outer ear, eyebrows, eyelids, and fingernails are formed.  The fetus can hear, swallow, and move its arms and legs.  The kidneys begin to produce urine.  The skin is covered with a white waxy coating (vernix) and very fine  hair (lanugo). Fifth month  The fetus moves around more and can be felt for the first time (quickening).  The fetus starts to sleep and wake up and may begin to suck its finger.  The nails grow to the end of the fingers.  The organ in the digestive system that makes bile (gallbladder) functions and helps to digest the nutrients.  If your baby is a girl, eggs are present in her ovaries. If your baby is a boy, testicles start to move down into his scrotum. Sixth month  The lungs are formed, but the fetus is not yet able to breathe.  The eyes open. The brain continues to develop.  Your baby has fingerprints and toe prints. Your baby's hair grows thicker.  At the end of the second trimester, the fetus is about 9 in (22.9 cm) long. Seventh month  The fetus kicks and stretches.  The eyes are developed enough to sense changes in light.  The hands can make a grasping  motion.  The fetus responds to sound. Eighth month  All organs and body systems are fully developed and functioning.  Bones harden and taste buds develop. The fetus may hiccup.  Certain areas of the brain are still developing. The skull remains soft. Ninth month  The fetus gains about  lb (0.23 kg) each week.  The lungs are fully developed.  Patterns of sleep develop.  The fetus's head typically moves into a head-down position (vertex) in the uterus to prepare for birth. If the buttocks move into a vertex position instead, the baby is breech.  The fetus weighs 6-9 lbs (2.72-4.08 kg) and is 19-20 in (48.26-50.8 cm) long. WHAT CAN I DO TO HAVE A HEALTHY PREGNANCY AND HELP MY BABY DEVELOP? Eating and Drinking  Eat a healthy diet.  Talk with your health care provider to make sure that you are getting the nutrients that you and your baby need.  Visit www.[employer].be to learn about creating a healthy diet.  Gain a healthy amount of weight during pregnancy as advised by your health care provider. This is  usually 25-35 pounds. You may need to:  Gain more if you were underweight before getting pregnant or if you are pregnant with more than one baby.  Gain less if you were overweight or obese when you got pregnant. Medicines and Vitamins  Take prenatal vitamins as directed by your health care provider. These include vitamins such as folic acid, iron, calcium, and vitamin D. They are important for healthy development.  Take medicines only as directed by your health care provider. Read labels and ask a pharmacist or your health care provider whether over-the-counter medicines, supplements, and prescription drugs are safe to take during pregnancy. Activities  Be physically active as advised by your health care provider. Ask your health care provider to recommend activities that are safe for you to do, such as walking or swimming.  Do not participate in strenuous or extreme sports. Lifestyle  Do not drink alcohol.  Do not use any tobacco products, including cigarettes, chewing tobacco, or electronic cigarettes. If you need help quitting, ask your health care provider.  Do not use illegal drugs. Safety  Avoid exposure to mercury, lead, or other heavy metals. Ask your health care provider about common sources of these heavy metals.  Avoid listeria infection during pregnancy. Follow these precautions:  Do not eat soft cheeses or deli meats.  Do not eat hot dogs unless they have been warmed up to the point of steaming, such as in the microwave oven.  Do not drink unpasteurized milk.  Avoid toxoplasmosis infection during pregnancy. Follow these precautions:  Do not change your cat's litter box, if you have a cat. Ask someone else to do this for you.  Wear gardening gloves while working in the yard. General Instructions  Keep all follow-up visits as directed by your health care provider. This is important. This includes prenatal care and screening tests.  Manage any chronic health  conditions. Work closely with your health care provider to keep conditions, such as diabetes, under control. HOW DO I KNOW IF MY BABY IS DEVELOPING WELL? At each prenatal visit, your health care provider will do several different tests to check on your health and keep track of your baby's development. These include:  Fundal height.  Your health care provider will measure your growing belly from top to bottom using a tape measure.  Your health care provider will also feel your belly to determine your baby's position.  Heartbeat.  An ultrasound in the first trimester can confirm pregnancy and show a heartbeat, depending on how far along you are.  Your health care provider will check your baby's heart rate at every prenatal visit.  As you get closer to your delivery date, you may have regular fetal heart rate monitoring to make sure that your baby is not in distress.  Second trimester ultrasound.  This ultrasound checks your baby's development. It also indicates your baby's gender. WHAT SHOULD I DO IF I HAVE CONCERNS ABOUT MY BABY'S DEVELOPMENT? Always talk with your health care provider about any concerns that you may have.   This information is not intended to replace advice given to you by your health care provider. Make sure you discuss any questions you have with your health care provider.   Document Released: 03/25/2008 Document Revised: 06/28/2015 Document Reviewed: 03/16/2014 Elsevier Interactive Patient Education [employer]

## 2016-07-04 ENCOUNTER — Emergency Department (HOSPITAL_BASED_OUTPATIENT_CLINIC_OR_DEPARTMENT_OTHER)
Admission: EM | Admit: 2016-07-04 | Discharge: 2016-07-04 | Disposition: A | Discharge status: Home / Self Care | Payer: Medicaid Other | Attending: Emergency Medicine | Admitting: Emergency Medicine

## 2016-07-04 ENCOUNTER — Encounter (HOSPITAL_BASED_OUTPATIENT_CLINIC_OR_DEPARTMENT_OTHER): Payer: Self-pay | Admitting: Emergency Medicine

## 2016-07-04 DIAGNOSIS — K029 Dental caries, unspecified: Secondary | ICD-10-CM | POA: Insufficient documentation

## 2016-07-04 DIAGNOSIS — K0889 Other specified disorders of teeth and supporting structures: Secondary | ICD-10-CM | POA: Diagnosis present

## 2016-07-04 DIAGNOSIS — Z87891 Personal history of nicotine dependence: Secondary | ICD-10-CM | POA: Insufficient documentation

## 2016-07-04 MED ORDER — PENICILLIN V POTASSIUM 500 MG PO TABS: 500.0000 mg | ORAL_TABLET | Freq: Four times a day (QID) | ORAL | 0 refills | Status: DC

## 2016-07-04 MED ORDER — PENICILLIN V POTASSIUM 250 MG PO TABS
500.0000 mg | ORAL_TABLET | Freq: Once | ORAL | Status: AC
  Administered 2016-07-04: 500 mg via ORAL
  Filled 2016-07-04: qty 2

## 2016-07-04 NOTE — ED Provider Notes (Signed)
TIME SEEN: 3:30 AM  CHIEF COMPLAINT: dental pain  HPI: Pt is a 35 y.o. female with no significant past medical history who presents to the emergency department with dental pain. Patient is currently pregnant (28 weeks 6 days), LMP was February 24th 2017.  States pain started in the left upper posterior molar tonight. States she is scheduled to have it pulled by a [facility] next [facility]. No facial swelling. No difficulty swallowing, speaking or breathing. No fever. No vomiting. Took Tylenol prior to arrival.  ROS: See HPI Constitutional: no fever  Eyes: no drainage  ENT: no runny nose   Cardiovascular:  no chest pain  Resp: no SOB  GI: no vomiting GU: no dysuria Integumentary: no rash  Allergy: no hives  Musculoskeletal: no leg swelling  Neurological: no slurred speech ROS otherwise negative  PAST MEDICAL HISTORY/PAST SURGICAL HISTORY:  Past Medical History:  Diagnosis Date  . Bronchitis     MEDICATIONS:  Prior to Admission medications   Medication Sig Start Date End Date Taking? Authorizing Provider  cephALEXin (KEFLEX) 500 MG capsule Take 1 capsule (500 mg total) by mouth 2 (two) times daily. 02/17/16   Tilden FossaElizabeth Rees, MD  metoCLOPramide (REGLAN) 10 MG tablet Take 1 tablet (10 mg total) by mouth every 8 (eight) hours as needed for nausea or vomiting. 03/23/16   Tomasita CrumbleAdeleke Oni, MD  ondansetron (ZOFRAN ODT) 4 MG disintegrating tablet Take 1 tablet (4 mg total) by mouth every 8 (eight) hours as needed for nausea or vomiting. 11/02/15   Carlene CoriaSerena Y Sam, PA-C    ALLERGIES:  No Known Allergies  SOCIAL HISTORY:  Social History  Substance Use Topics  . Smoking status: Former Smoker    Packs/day: 0.50  . Smokeless tobacco: Never Used  . Alcohol use No    FAMILY HISTORY: No family history on file.  EXAM: BP 107/68 (BP Location: Left Arm)   Pulse 76   Temp 98.4 F (36.9 C) (Oral)   Resp 18   Ht 5\' 2"  (1.575 m)   Wt 154 lb (69.9 kg)   LMP 12/15/2015 (Approximate)   SpO2  99%   BMI 28.17 kg/m  CONSTITUTIONAL: Alert and oriented and responds appropriately to questions. Well-appearing; well-nourished HEAD: Normocephalic EYES: Conjunctivae clear, PERRL ENT: normal nose; no rhinorrhea; moist mucous membranes; No pharyngeal erythema or petechiae, no tonsillar hypertrophy or exudate, no uvular deviation, no trismus or drooling, normal phonation, no stridor, multiple dental caries present, tender over the left upper posterior molar, no drainable dental abscess noted, no Ludwig's angina, tongue sits flat in the bottom of the mouth, no angioedema, no facial erythema or warmth, no facial swelling NECK: Supple, no meningismus, no LAD  CARD: RRR; S1 and S2 appreciated; no murmurs, no clicks, no rubs, no gallops RESP: Normal chest excursion without splinting or tachypnea; breath sounds clear and equal bilaterally; no wheezes, no rhonchi, no rales, no hypoxia or respiratory distress, speaking full sentences ABD/GI: Normal bowel sounds; non-distended; soft, non-tender, no rebound, no guarding, no peritoneal signs BACK:  The back appears normal and is non-tender to palpation, there is no CVA tenderness EXT: Normal ROM in all joints; non-tender to palpation; no edema; normal capillary refill; no cyanosis, no calf tenderness or swelling    SKIN: Normal color for age and race; warm; no rash NEURO: Moves all extremities equally, sensation to light touch intact diffusely, cranial nerves II through XII intact PSYCH: The patient's mood and manner are appropriate. Grooming and personal hygiene are appropriate.  MEDICAL DECISION MAKING: Patient here with dental pain secondary to dental caries. Will treat with penicillin for possible periapical abscess. No drainable abscess on exam. No sign of Ludwig's angina, facial cellulitis. Have recommended continuing Tylenol for pain and adding on Orajel. Discussed with patient she should not be taking NSAIDs since she is pregnant. Also discussed with  patient I do not follow narcotics are appropriate in the setting of non-complicated dental brain in a pregnant patient. She has outpatient dental follow-up rescheduled. We'll discharge with prescription for penicillin. Discussed return precautions.   At this time, I do not feel there is any life-threatening condition present. I have reviewed and discussed all results (EKG, imaging, lab, urine as appropriate), exam findings with patient/family. I have reviewed nursing notes and appropriate previous records.  I feel the patient is safe to be discharged home without further emergent workup and can continue workup as an outpatient as needed. Discussed usual and customary return precautions. Patient/family verbalize understanding and are comfortable with this plan.  Outpatient follow-up has been provided. All questions have been answered.     Cassandra Wade Cassandra Vickrey, DO 07/04/16 [phone]

## 2016-07-04 NOTE — Discharge Instructions (Signed)
Please follow-up with your dentist as scheduled next week. You may use over-the-counter Tylenol 1000 mg every 6 hours as needed for pain. You may use Orajel over-the-counter as needed as well. Please take your antibiotics until they are completed.

## 2016-07-04 NOTE — ED Triage Notes (Signed)
Pt is 6 months pregnant

## 2016-07-04 NOTE — ED Triage Notes (Signed)
Pt c/o left sided dental pain that started tonight.

## 2017-06-01 IMAGING — CR DG CHEST 2V
2 series · 2 of 2 positions shown · non-contrast
Comparison: None.

CLINICAL DATA: Syncope and cough for 1 day. Epigastric pain, nausea
and vomiting. Initial encounter.

EXAM:
CHEST  2 VIEW

[w chest pa]
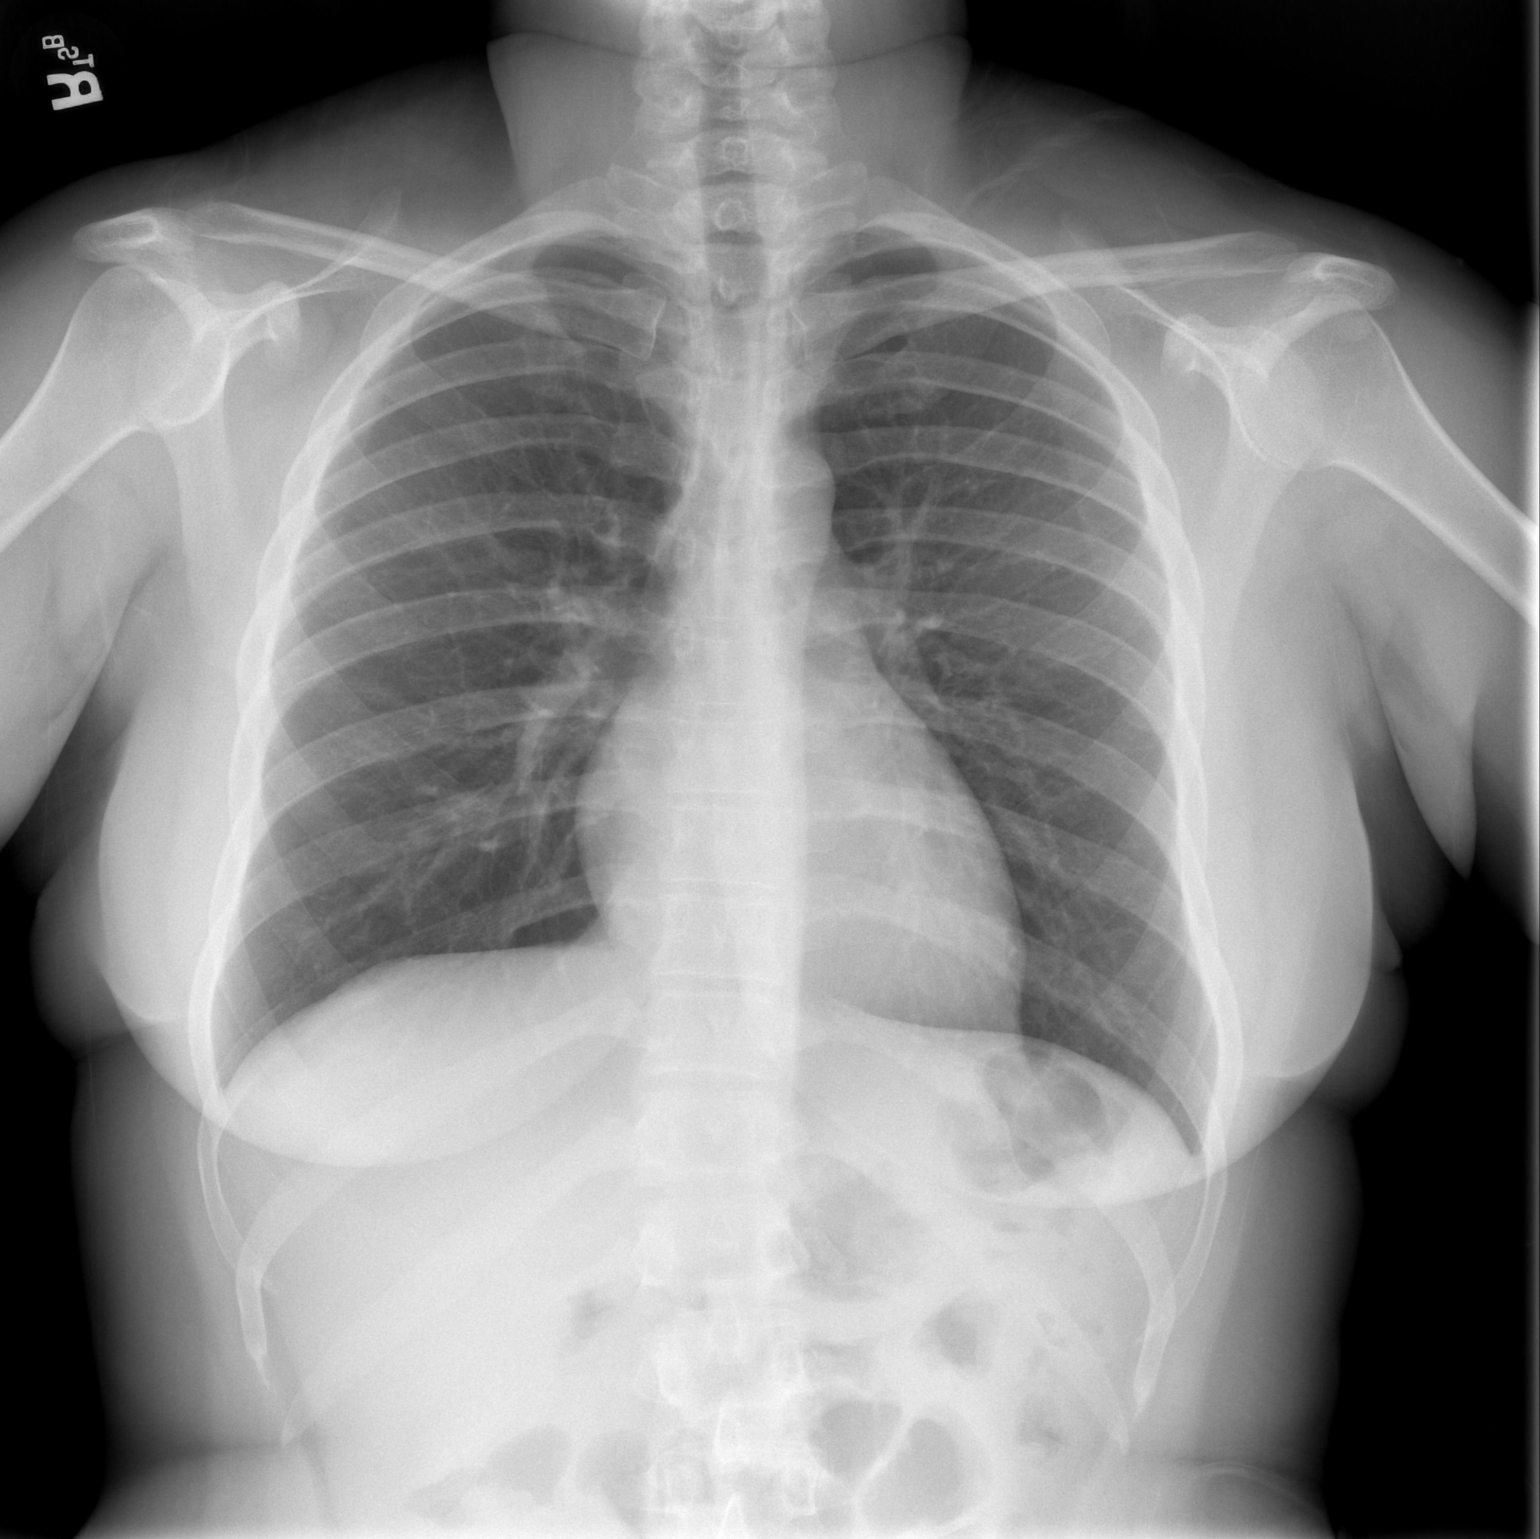

[w chest lat]
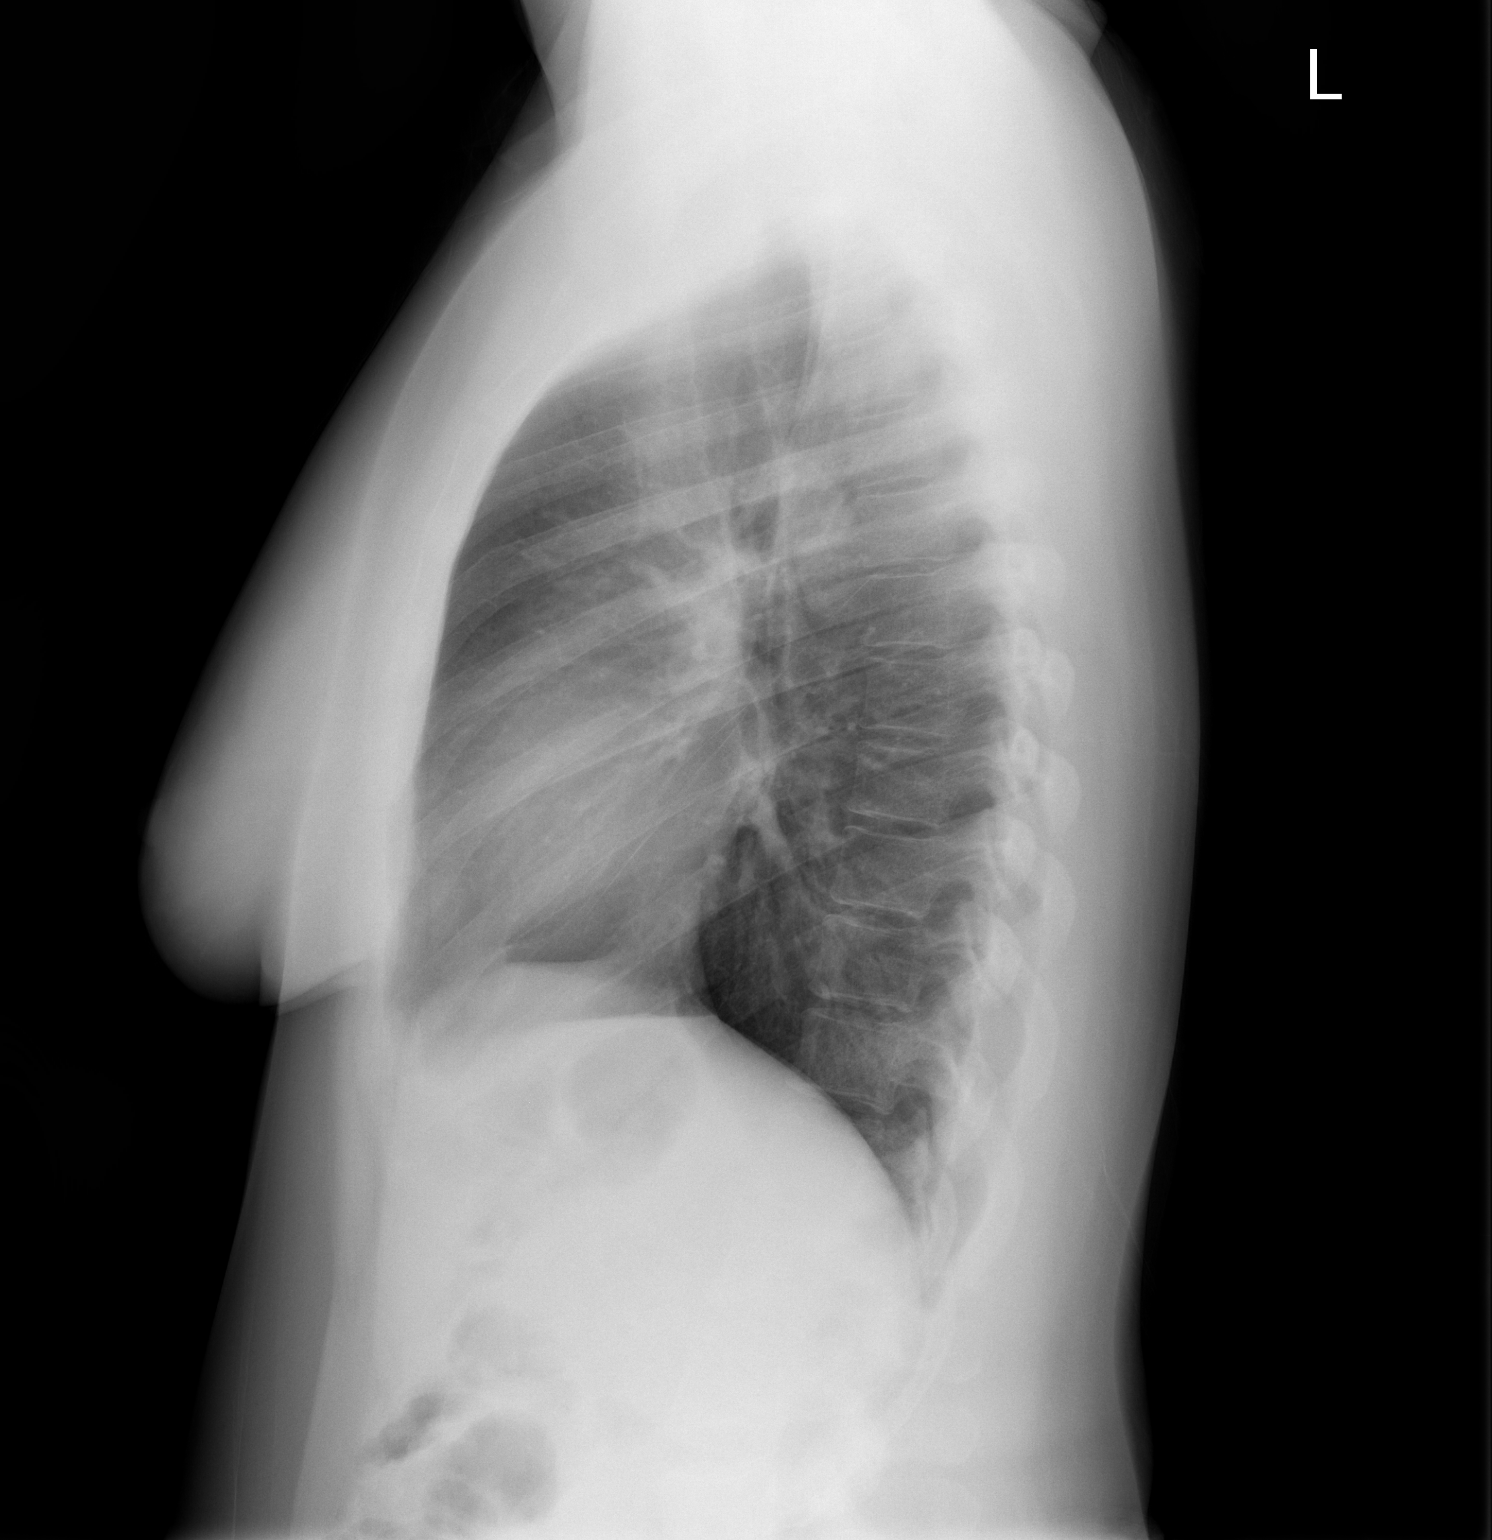

[2 of 2 positions shown; findings below may reference images not displayed]

FINDINGS: The lungs are clear. Heart size is normal. No pneumothorax or
pleural fluid. No bony abnormality.
IMPRESSION: Normal chest.

## 2019-09-04 ENCOUNTER — Encounter (HOSPITAL_COMMUNITY): Payer: Self-pay

## 2019-09-04 ENCOUNTER — Inpatient Hospital Stay (HOSPITAL_COMMUNITY): Payer: Medicaid Other

## 2019-09-04 ENCOUNTER — Inpatient Hospital Stay (HOSPITAL_COMMUNITY)
Admission: AD | Admit: 2019-09-04 | Discharge: 2019-09-04 | Disposition: A | Discharge status: Home / Self Care | Payer: Medicaid Other | Attending: Obstetrics and Gynecology | Admitting: Obstetrics and Gynecology

## 2019-09-04 ENCOUNTER — Other Ambulatory Visit: Payer: Self-pay

## 2019-09-04 DIAGNOSIS — O26891 Other specified pregnancy related conditions, first trimester: Secondary | ICD-10-CM

## 2019-09-04 DIAGNOSIS — F17210 Nicotine dependence, cigarettes, uncomplicated: Secondary | ICD-10-CM | POA: Insufficient documentation

## 2019-09-04 DIAGNOSIS — O200 Threatened abortion: Secondary | ICD-10-CM

## 2019-09-04 DIAGNOSIS — O209 Hemorrhage in early pregnancy, unspecified: Secondary | ICD-10-CM

## 2019-09-04 DIAGNOSIS — J45909 Unspecified asthma, uncomplicated: Secondary | ICD-10-CM | POA: Diagnosis not present

## 2019-09-04 DIAGNOSIS — O99331 Smoking (tobacco) complicating pregnancy, first trimester: Secondary | ICD-10-CM | POA: Diagnosis not present

## 2019-09-04 DIAGNOSIS — Z3A08 8 weeks gestation of pregnancy: Secondary | ICD-10-CM

## 2019-09-04 DIAGNOSIS — R109 Unspecified abdominal pain: Secondary | ICD-10-CM

## 2019-09-04 DIAGNOSIS — Z3A01 Less than 8 weeks gestation of pregnancy: Secondary | ICD-10-CM | POA: Diagnosis not present

## 2019-09-04 DIAGNOSIS — O99511 Diseases of the respiratory system complicating pregnancy, first trimester: Secondary | ICD-10-CM | POA: Diagnosis not present

## 2019-09-04 HISTORY — DX: Herpesviral infection, unspecified: B00.9

## 2019-09-04 HISTORY — DX: Unspecified asthma, uncomplicated: J45.909

## 2019-09-04 LAB — CBC
HCT: 37.4 % (ref 36.0–46.0)
Hemoglobin: 12.4 g/dL (ref 12.0–15.0)
MCH: 32.4 pg (ref 26.0–34.0)
MCHC: 33.2 g/dL (ref 30.0–36.0)
MCV: 97.7 fL (ref 80.0–100.0)
Platelets: 267 10*3/uL (ref 150–400)
RBC: 3.83 MIL/uL — ABNORMAL LOW (ref 3.87–5.11)
RDW: 11.5 % (ref 11.5–15.5)
WBC: 8.2 10*3/uL (ref 4.0–10.5)
nRBC: 0 % (ref 0.0–0.2)

## 2019-09-04 LAB — URINALYSIS, ROUTINE W REFLEX MICROSCOPIC
Bilirubin Urine: NEGATIVE
Glucose, UA: NEGATIVE mg/dL
Ketones, ur: NEGATIVE mg/dL
Nitrite: NEGATIVE
Protein, ur: NEGATIVE mg/dL
Specific Gravity, Urine: 1.023 (ref 1.005–1.030)
pH: 7 (ref 5.0–8.0)

## 2019-09-04 LAB — WET PREP, GENITAL
Sperm: NONE SEEN
Trich, Wet Prep: NONE SEEN
Yeast Wet Prep HPF POC: NONE SEEN

## 2019-09-04 LAB — HCG, QUANTITATIVE, PREGNANCY: hCG, Beta Chain, Quant, S: 23677 m[IU]/mL — ABNORMAL HIGH (ref <5)

## 2019-09-04 LAB — ABO/RH: ABO/RH(D): A POS

## 2019-09-04 NOTE — MAU Note (Signed)
Cassandra Wade is a 38 y.o. here in MAU reporting: abdominal pain since last night. States when using the bathroom in MAU she saw a little bit of light bleeding on the toilet paper. Has not done a UPT at home- states she is scared to look at it.   LMP: 07/10/19  Onset of complaint: pain started yesterday, bleeding today  Pain score: 5/10  Vitals:   09/04/19 1403  BP: (!) 112/59  Pulse: 75  Resp: 16  Temp: 98.3 F (36.8 C)  SpO2: 100%     Lab orders placed from triage: UPT, UA

## 2019-09-04 NOTE — MAU Provider Note (Signed)
Chief Complaint: Abdominal Pain, Vaginal Bleeding, and Possible Pregnancy   First Provider Initiated Contact with Patient 09/04/19 1433     SUBJECTIVE HPI: Cassandra Wade is a 38 y.o. [id number] at [redacted]w[redacted]d by LMP who presents to [facility] Admissions reporting abdominal cramping & vaginal spotting. Reports abdominal cramping since last night & pink spotting on toilet paper when she arrived to MAU today. States she was unsure if she was pregnant & was too "scared to look at the pregnancy test".  Denies n/v/d, dysuria, vaginal discharge, or recent intercourse.   Location: abdomen Quality: cramping Severity: 5/10 on pain scale Duration: 1 day Timing: intermittent Modifying factors: none Associated signs and symptoms: vaginal spotting  Past Medical History:  Diagnosis Date  . Asthma   . Bronchitis   . 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 CESAREAN SECTION     Social History   Socioeconomic History  . Marital status: Single    Spouse name: Not on file  . Number of children: Not on file  . Years of education: Not on file  . Highest education level: Not on file  Occupational History  . Not on file  Social Needs  . Financial resource strain: Not on file  . Food insecurity    Worry: Not on file    Inability: Not on file  . Transportation needs    Medical: Not on file    Non-medical: Not on file  Tobacco Use  . Smoking status: Current Some Day Smoker    Packs/day: 0.50  . Smokeless tobacco: Never Used  Substance and Sexual Activity  . Alcohol use: No  . Drug use: No  .  Sexual activity: Never    Birth control/protection: None  Lifestyle  . Physical activity    Days per week: Not on file    Minutes per session: Not on file  . Stress: Not on file  Relationships  . Social Herbalist on phone: Not on file    Gets together: Not on file    Attends religious service: Not on file    Active member of club or organization: Not on file    Attends meetings of clubs or organizations: Not on file    Relationship status: Not on file  . Intimate partner violence    Fear of current or ex partner: Not on file    Emotionally abused: Not on file    Physically abused: Not on file    Forced sexual activity: Not on file  Other Topics Concern  . Not on file  Social History Narrative  . Not on file   No family history on file. No current facility-administered medications on file prior to encounter.    No current outpatient medications on file prior to encounter.   No  Known Allergies  I have reviewed patient's Past Medical Hx, Surgical Hx, Family Hx, Social Hx, medications and allergies.   Review of Systems  Constitutional: Negative.   Gastrointestinal: Positive for abdominal pain. Negative for constipation, diarrhea, nausea and vomiting.  Genitourinary: Positive for vaginal bleeding. Negative for dysuria and vaginal discharge.    OBJECTIVE Patient Vitals for the past 24 hrs:  BP Temp Temp src Pulse Resp SpO2 Height Weight  09/04/19 1732 (!) 105/58 - - 60 - - - -  09/04/19 1403 (!) 112/59 98.3 F (36.8 C) Oral 75 16 100 % 5' (1.524 m) 57.7 kg   Constitutional: Well-developed, well-nourished female in no acute distress.  Cardiovascular: normal rate & rhythm, no murmur Respiratory: normal rate and effort. Lung sounds clear throughout GI: Abd soft, non-tender, Pos BS x 4. No guarding or rebound tenderness MS: Extremities nontender, no edema, normal ROM Neurologic: Alert and oriented x 4.  GU:     SPECULUM EXAM: NEFG, small amount of thin pink  discharge. No active bleeding. Cervix not friable  BIMANUAL: No CMT. cervix closed; uterus normal size, no adnexal tenderness or masses.    LAB RESULTS Results for orders placed or performed during the hospital encounter of 09/04/19 (from the past 24 hour(s))  Urinalysis, Routine w reflex microscopic     Status: Abnormal   Collection Time: 09/04/19  2:39 PM  Result Value Ref Range   Color, Urine YELLOW YELLOW   APPearance HAZY (A) CLEAR   Specific Gravity, Urine 1.023 1.005 - 1.030   pH 7.0 5.0 - 8.0   Glucose, UA NEGATIVE NEGATIVE mg/dL   Hgb urine dipstick SMALL (A) NEGATIVE   Bilirubin Urine NEGATIVE NEGATIVE   Ketones, ur NEGATIVE NEGATIVE mg/dL   Protein, ur NEGATIVE NEGATIVE mg/dL   Nitrite NEGATIVE NEGATIVE   Leukocytes,Ua TRACE (A) NEGATIVE   RBC / HPF 0-5 0 - 5 RBC/hpf   WBC, UA 0-5 0 - 5 WBC/hpf   Bacteria, UA RARE (A) NONE SEEN   Squamous Epithelial / LPF 0-5 0 - 5   Mucus PRESENT    Amorphous Crystal PRESENT   CBC     Status: Abnormal   Collection Time: 09/04/19  3:26 PM  Result Value Ref Range   WBC 8.2 4.0 - 10.5 K/uL   RBC 3.83 (L) 3.87 - 5.11 MIL/uL   Hemoglobin 12.4 12.0 - 15.0 g/dL   HCT 16.1 09.6 - 04.5 %   MCV 97.7 80.0 - 100.0 fL   MCH 32.4 26.0 - 34.0 pg   MCHC 33.2 30.0 - 36.0 g/dL   RDW 40.9 81.1 - 91.4 %   Platelets 267 150 - 400 K/uL   nRBC 0.0 0.0 - 0.2 %  ABO/Rh     Status: None   Collection Time: 09/04/19  3:26 PM  Result Value Ref Range   ABO/RH(D) A POS    No rh immune globuloin      NOT A RH IMMUNE GLOBULIN CANDIDATE, PT RH POSITIVE Performed at [facility], [address] [place], [place] [address]   hCG, quantitative, pregnancy     Status: Abnormal   Collection Time: 09/04/19  3:26 PM  Result Value Ref Range   hCG, Beta Chain, Quant, S 23,677 (H) <5 mIU/mL  Wet prep, genital     Status: Abnormal   Collection Time: 09/04/19  4:54 PM  Result Value Ref Range   Yeast Wet Prep HPF POC NONE SEEN NONE SEEN   Trich, Wet Prep NONE  SEEN NONE SEEN   Clue Cells Wet Prep HPF POC PRESENT (A) NONE SEEN   WBC, Wet Prep HPF POC MODERATE (A) NONE SEEN   Sperm NONE SEEN     IMAGING [place] Ob Less Than 14 Weeks With Ob Transvaginal  Result Date: 09/04/2019 CLINICAL DATA:  Vaginal bleeding, early pregnancy. EXAM: OBSTETRIC <14 WK US AND TRANSVAGINAL OB US TECHNIQUE: Both transabdominal and transvaginal ultrasound examinations were performed for complete evaluation of the gestation as well as the maternal uterus, adnexal regions, and pelvic cul-de-sac. Transvaginal technique was performed to assess early pregnancy. COMPARISON:  None. FINDINGS: Intrauterine gestational sac: Present, single. Mild irregularity of the sac. Yolk sac:  PRESENT Embryo:  Present Cardiac Activity: Absent Heart Rate: N/A  bpm MSD: 15.2 mm   6 w   2 d CRL:  2.2 mm   5 w   5 d                  US EDC: 05/01/2020 Subchorionic hemorrhage:  Small Maternal uterus/adnexae: Both ovaries appear unremarkable. No significant free pelvic fluid. IMPRESSION: 1. Single intrauterine pregnancy with crown-rump length of 2.2 mm compatible with 5 weeks 5 days gestation. Embryonic cardiac activity is not visualized, although is commonly not detected at this stage of development. There is a small subchorionic hemorrhage and some mild irregularity of the intrauterine gestational sac which can be negative prognostic indicators for successful pregnancy. Recommend follow-up US in 10-14 days for definitive diagnosis. This recommendation follows SRU consensus guidelines: Diagnostic Criteria for Nonviable Pregnancy Early in the First Trimester. Malva Limes Engl J Med 2013; 956:2130-86; 369:1443-51. Electronically Signed   By: Gaylyn RongWalter  Liebkemann M.D.   On: 09/04/2019 16:46    MAU COURSE Orders Placed This Encounter  Procedures  . Wet prep, genital  . US OB LESS THAN 14 WEEKS WITH OB TRANSVAGINAL  . F/u ultrasound in clinic  . Urinalysis, Routine w reflex microscopic  . CBC  . hCG, quantitative, pregnancy  . ABO/Rh   . Discharge patient   No orders of the defined types were placed in this encounter.   MDM +UPT UA, wet prep, GC/chlamydia, CBC, ABO/Rh, quant hCG, and US today to rule out ectopic pregnancy  Per Care Everywhere, patient [facility] on [facility]6 & 11/13. Was diagnosed with probable missed abortion with plan to have f/u ultrasound in office next week for confirmation. Pt states she's here today for a second opinion & she's not happy with [facility].  Per note on 11/6, she had an ultrasound that showed an IUP with CRL 4236w5d and ?FHR that couldn't be measured. Yesterday, CRL measured 6527w6d with no cardiac activity.   Ultrasound today shows IUP measuring 936w5d & no cardiac activity  RH positive  Reviewed patient with Dr. Vergie LivingPickens. Only 8 days between scans, can't definitively call failed pregnancy. Agree with patient's ob to re scan next week for confirmation.   Patient is agreeable with plan but would like to come to our office as she is unhappy with [place] & does not want to return there.   ASSESSMENT 1. Threatened miscarriage   2. Vaginal bleeding in pregnancy, first trimester   3. Abdominal pain during pregnancy in first trimester     PLAN Discharge home in stable condition. SAB precautions GC/CT pending Outpatient ultrasound ordered for next week Msg to [facility] for f/u appointment   [facility] Information    [facility].   Specialty: Radiology  Why: the office will call you to schedule appointment Contact information: [address] [place] [address], [address] [id number] mc [place] [address] [phone]         Allergies as of 09/04/2019   No Known Allergies     Medication List    STOP taking these medications   metoCLOPramide 10 MG tablet Commonly known as: REGLAN   ondansetron 4 MG disintegrating tablet Commonly known as: Zofran ODT   penicillin v potassium 500 MG  tablet Commonly known as: Rodena Goldmann, NP 09/04/2019  6:06 PM

## 2019-09-04 NOTE — Discharge Instructions (Signed)
Miscarriage °A miscarriage is the loss of an unborn baby (fetus) before the 20th week of pregnancy. Most miscarriages happen during the first 3 months of pregnancy. Sometimes, a miscarriage can happen before a woman knows that she is pregnant. °Having a miscarriage can be an emotional experience. If you have had a miscarriage, talk with your health care provider about any questions you may have about miscarrying, the grieving process, and your plans for future pregnancy. °What are the causes? °A miscarriage may be caused by: °· Problems with the genes or chromosomes of the fetus. These problems make it impossible for the baby to develop normally. They are often the result of random errors that occur early in the development of the baby, and are not passed from parent to child (not inherited). °· Infection of the cervix or uterus. °· Conditions that affect hormone balance in the body. °· Problems with the cervix, such as the cervix opening and thinning before pregnancy is at term (cervical insufficiency). °· Problems with the uterus. These may include: °? A uterus with an abnormal shape. °? Fibroids in the uterus. °? Congenital abnormalities. These are problems that were present at birth. °· Certain medical conditions. °· Smoking, drinking alcohol, or using drugs. °· Injury (trauma). °In many cases, the cause of a miscarriage is not known. °What are the signs or symptoms? °Symptoms of this condition include: °· Vaginal bleeding or spotting, with or without cramps or pain. °· Pain or cramping in the abdomen or lower back. °· Passing fluid, tissue, or blood clots from the vagina. °How is this diagnosed? °This condition may be diagnosed based on: °· A physical exam. °· Ultrasound. °· Blood tests. °· Urine tests. °How is this treated? °Treatment for a miscarriage is sometimes not necessary if you naturally pass all the tissue that was in your uterus. If necessary, this condition may be treated with: °· Dilation and  curettage (D&C). This is a procedure in which the cervix is stretched open and the lining of the uterus (endometrium) is scraped. This is done only if tissue from the fetus or placenta remains in the body (incomplete miscarriage). °· Medicines, such as: °? Antibiotic medicine, to treat infection. °? Medicine to help the body pass any remaining tissue. °? Medicine to reduce (contract) the size of the uterus. These medicines may be given if you have a lot of bleeding. °If you have Rh negative blood and your baby was Rh positive, you will need a shot of a medicine called Rh immunoglobulinto protect your future babies from Rh blood problems. "Rh-negative" and "Rh-positive" refer to whether or not the blood has a specific protein found on the surface of red blood cells (Rh factor). °Follow these instructions at home: °Medicines ° °· Take over-the-counter and prescription medicines only as told by your health care provider. °· If you were prescribed antibiotic medicine, take it as told by your health care provider. Do not stop taking the antibiotic even if you start to feel better. °· Do not take NSAIDs, such as aspirin and ibuprofen, unless they are approved by your health care provider. These medicines can cause bleeding. °Activity °· Rest as directed. Ask your health care provider what activities are safe for you. °· Have someone help with home and family responsibilities during this time. °General instructions °· Keep track of the number of sanitary pads you use each day and how soaked (saturated) they are. Write down this information. °· Monitor the amount of tissue or blood clots that   you pass from your vagina. Save any large amounts of tissue for your health care provider to examine. °· Do not use tampons, douche, or have sex until your health care provider approves. °· To help you and your partner with the process of grieving, talk with your health care provider or seek counseling. °· When you are ready, meet with  your health care provider to discuss any important steps you should take for your health. Also, discuss steps you should take to have a healthy pregnancy in the future. °· Keep all follow-up visits as told by your health care provider. This is important. °Where to find more information °· The American Congress of Obstetricians and Gynecologists: www.acog.org °· U.S. Department of Health and Human Services Office of Women’s Health: www.womenshealth.gov °Contact a health care provider if: °· You have a fever or chills. °· You have a foul smelling vaginal discharge. °· You have more bleeding instead of less. °Get help right away if: °· You have severe cramps or pain in your back or abdomen. °· You pass blood clots or tissue from your vagina that is walnut-sized or larger. °· You soak more than 1 regular sanitary pad in an hour. °· You become light-headed or weak. °· You pass out. °· You have feelings of sadness that take over your thoughts, or you have thoughts of hurting yourself. °Summary °· Most miscarriages happen in the first 3 months of pregnancy. Sometimes miscarriage happens before a woman even knows that she is pregnant. °· Follow your health care provider's instruction for home care. Keep all follow-up appointments. °· To help you and your partner with the process of grieving, talk with your health care provider or seek counseling. °This information is not intended to replace advice given to you by your health care provider. Make sure you discuss any questions you have with your health care provider. °Document Released: 04/02/2001 Document Revised: 01/29/2019 Document Reviewed: 11/12/2016 °Elsevier Patient Education © 2020 [employer] ° °

## 2019-09-06 LAB — GC/CHLAMYDIA PROBE AMP (~~LOC~~) NOT AT ARMC
Chlamydia: NEGATIVE
Comment: NEGATIVE
Comment: NORMAL
Neisseria Gonorrhea: NEGATIVE

## 2019-09-07 LAB — POCT PREGNANCY, URINE: Preg Test, Ur: POSITIVE — AB

## 2019-09-14 ENCOUNTER — Encounter: Payer: Self-pay | Admitting: Medical

## 2019-09-14 ENCOUNTER — Ambulatory Visit: Payer: Medicaid Other | Admitting: Medical

## 2023-11-13 ENCOUNTER — Encounter (HOSPITAL_BASED_OUTPATIENT_CLINIC_OR_DEPARTMENT_OTHER): Payer: Self-pay | Admitting: *Deleted

## 2023-11-13 ENCOUNTER — Emergency Department (HOSPITAL_BASED_OUTPATIENT_CLINIC_OR_DEPARTMENT_OTHER)
Admission: EM | Admit: 2023-11-13 | Discharge: 2023-11-13 | Disposition: A | Discharge status: Home / Self Care | Payer: Medicaid Other | Attending: Emergency Medicine | Admitting: Emergency Medicine

## 2023-11-13 ENCOUNTER — Other Ambulatory Visit: Payer: Self-pay

## 2023-11-13 DIAGNOSIS — J45909 Unspecified asthma, uncomplicated: Secondary | ICD-10-CM | POA: Insufficient documentation

## 2023-11-13 DIAGNOSIS — Z1152 Encounter for screening for COVID-19: Secondary | ICD-10-CM | POA: Diagnosis not present

## 2023-11-13 DIAGNOSIS — J069 Acute upper respiratory infection, unspecified: Secondary | ICD-10-CM | POA: Diagnosis present

## 2023-11-13 DIAGNOSIS — J101 Influenza due to other identified influenza virus with other respiratory manifestations: Secondary | ICD-10-CM | POA: Insufficient documentation

## 2023-11-13 LAB — RESP PANEL BY RT-PCR (RSV, FLU A&B, COVID)  RVPGX2
Influenza A by PCR: POSITIVE — AB
Influenza B by PCR: NEGATIVE
Resp Syncytial Virus by PCR: NEGATIVE
SARS Coronavirus 2 by RT PCR: NEGATIVE

## 2023-11-13 MED ORDER — FLUTICASONE PROPIONATE 50 MCG/ACT NA SUSP: 2.0000 | Freq: Every day | NASAL | 1 refills | Status: DC

## 2023-11-13 MED ORDER — ONDANSETRON HCL 4 MG PO TABS: 4.0000 mg | ORAL_TABLET | Freq: Three times a day (TID) | ORAL | 0 refills | Status: DC | PRN

## 2023-11-13 MED ORDER — ACETAMINOPHEN 325 MG PO TABS
650.0000 mg | ORAL_TABLET | Freq: Once | ORAL | Status: AC
  Administered 2023-11-13: 650 mg via ORAL
  Filled 2023-11-13: qty 2

## 2023-11-13 MED ORDER — GUAIFENESIN ER 600 MG PO TB12: 1200.0000 mg | ORAL_TABLET | Freq: Two times a day (BID) | ORAL | 0 refills | Status: DC

## 2023-11-13 MED ORDER — GUAIFENESIN ER 600 MG PO TB12: 1200.0000 mg | ORAL_TABLET | Freq: Two times a day (BID) | ORAL | 0 refills | Status: AC

## 2023-11-13 NOTE — ED Triage Notes (Signed)
Pt is brought in by ems from home for flu like symptoms x over a week.  Pt has been having URI with cough and cold and body aches and chills.

## 2023-11-13 NOTE — Discharge Instructions (Signed)
Today you are seen for influenza A infection.  Please pick up your medication and take as prescribed.  You may use Flonase for nasal congestion, Mucinex for thinning your mucus, Zofran for nausea, Tylenol/Motrin for pain and fever.  Thank you for letting us treat you today. After performing a physical exam and reviewing your labs, I feel you are safe to go home. Please follow up with your PCP in the next several days and provide them with your records from this visit. Return to the Emergency Room if pain becomes severe or symptoms worsen.

## 2023-11-13 NOTE — ED Provider Notes (Signed)
[place] [facility] Provider Note   CSN: [id number] Arrival date & time: 11/13/23  1737     History  Chief Complaint  Patient presents with   URI    Cassandra Wade is a 43 y.o. female past medical history significant for asthma presents today for flulike symptoms x 2 days.  Patient endorses cough, body aches, chills, fever, nausea, vomiting, and congestion.  Patient denies abdominal pain, chest pain, shortness of breath, sore throat, or diarrhea.   URI Presenting symptoms: cough        Home Medications Prior to Admission medications   Medication Sig Start Date End Date Taking? Authorizing Provider  fluticasone (FLONASE) 50 MCG/ACT nasal spray Place 2 sprays into both nostrils daily. 11/13/23  Yes Dolphus Jenny, PA-C  guaiFENesin (MUCINEX) 600 MG 12 hr tablet Take 2 tablets (1,200 mg total) by mouth 2 (two) times daily for 14 days. 11/13/23 11/27/23 Yes Dolphus Jenny, PA-C  ondansetron (ZOFRAN) 4 MG tablet Take 1 tablet (4 mg total) by mouth every 8 (eight) hours as needed for nausea or vomiting. 11/13/23  Yes Dolphus Jenny, PA-C      Allergies    Patient has no known allergies.    Review of Systems   Review of Systems  Constitutional:  Positive for chills.  Respiratory:  Positive for cough.     Physical Exam Updated Vital Signs BP 111/73   Pulse (!) 108   Temp 100.1 F (37.8 C) (Oral)   Resp (!) 28   SpO2 95%  Physical Exam Vitals and nursing note reviewed.  Constitutional:      General: She is not in acute distress.    Appearance: She is well-developed.  HENT:     Head: Normocephalic and atraumatic.  Eyes:     Conjunctiva/sclera: Conjunctivae normal.  Cardiovascular:     Rate and Rhythm: Normal rate and regular rhythm.     Heart sounds: No murmur heard. Pulmonary:     Effort: Pulmonary effort is normal. No respiratory distress.     Breath sounds: Normal breath sounds.  Abdominal:     Palpations: Abdomen is soft.      Tenderness: There is no abdominal tenderness.  Musculoskeletal:        General: No swelling.     Cervical back: Neck supple.  Skin:    General: Skin is warm and dry.     Capillary Refill: Capillary refill takes less than 2 seconds.  Neurological:     Mental Status: She is alert.  Psychiatric:        Mood and Affect: Mood normal.     ED Results / Procedures / Treatments   Labs (all labs ordered are listed, but only abnormal results are displayed) Labs Reviewed  RESP PANEL BY RT-PCR (RSV, FLU A&B, COVID)  RVPGX2 - Abnormal; Notable for the following components:      Result Value   Influenza A by PCR POSITIVE (*)    All other components within normal limits    EKG None  Radiology No results found.  Procedures Procedures    Medications Ordered in ED Medications - No data to display  ED Course/ Medical Decision Making/ A&P                                 Medical Decision Making  This patient presents to the ED with chief complaint(s) of URI  symptoms with pertinent past medical history of asthma which further complicates the presenting complaint. The complaint involves an extensive differential diagnosis and also carries with it a high risk of complications and morbidity.    The differential diagnosis includes COVID, flu, RSV, URI, asthma exacerbation  Additional history obtained: Records reviewed Care Everywhere/External Records  ED Course and Reassessment: Patient given p.o. challenge with Gatorade light and able to tolerate oral intake  Independent labs interpretation:  The following labs were independently interpreted:  Respiratory panel: Positive for influenza A  Consultation: - Consulted or discussed management/test interpretation w/ external professional: None  Consideration for admission or further workup: Considered for admission or further workup however patient's vital signs, physical exam, and labs are reassuring.  Patient's symptoms likely due  to influenza A infection. Patient will be given symptomatic management for influenza A infection to include Flonase for congestion, Mucinex to thin her mucus, Zofran for nausea and vomiting, and Tylenol/Motrin for pain/fever.  Patient should follow-up with primary care physician if her symptoms persist for further evaluation and treatment.  Patient was able to tolerate oral intake prior to discharge without difficulty.        Final Clinical Impression(s) / ED Diagnoses Final diagnoses:  Influenza A    Rx / DC Orders ED Discharge Orders          Ordered    ondansetron (ZOFRAN) 4 MG tablet  Every 8 hours PRN        11/13/23 2214    fluticasone (FLONASE) 50 MCG/ACT nasal spray  Daily        11/13/23 2214    guaiFENesin (MUCINEX) 600 MG 12 hr tablet  2 times daily        11/13/23 2214              Dolphus Jenny, PA-C 11/13/23 2214    Laurence Spates, MD 11/14/23 1440

## 2023-11-13 NOTE — ED Notes (Signed)
Pt d/c home per EDP order. Discharge summary reviewed, verbalize understanding. NAD, Reports UBER is ride home

## 2024-06-09 ENCOUNTER — Ambulatory Visit
Admission: EM | Admit: 2024-06-09 | Discharge: 2024-06-09 | Disposition: A | Discharge status: Home / Self Care | Attending: Family Medicine | Admitting: Family Medicine

## 2024-06-09 DIAGNOSIS — R319 Hematuria, unspecified: Secondary | ICD-10-CM

## 2024-06-09 DIAGNOSIS — R112 Nausea with vomiting, unspecified: Secondary | ICD-10-CM

## 2024-06-09 DIAGNOSIS — Z3201 Encounter for pregnancy test, result positive: Secondary | ICD-10-CM

## 2024-06-09 LAB — POCT URINE DIPSTICK
Bilirubin, UA: NEGATIVE
Glucose, UA: NEGATIVE mg/dL
Nitrite, UA: NEGATIVE
POC PROTEIN,UA: 30 — AB
Spec Grav, UA: 1.02 (ref 1.010–1.025)
Urobilinogen, UA: 1 U/dL
pH, UA: 7 (ref 5.0–8.0)

## 2024-06-09 LAB — POCT URINE PREGNANCY: Preg Test, Ur: POSITIVE — AB

## 2024-06-09 MED ORDER — DOXYLAMINE-PYRIDOXINE 10-10 MG PO TBEC: 1.0000 | DELAYED_RELEASE_TABLET | Freq: Two times a day (BID) | ORAL | 0 refills | Status: DC

## 2024-06-09 NOTE — Discharge Instructions (Addendum)
 Your pregnancy test was positive and therefore I recommend seeking a consultation with an obstetrician.  Please start a prenatal vitamin.  Avoid any alcohol use.  Do not use any nonsteroidal anti-inflammatories (NSAIDs) like ibuprofen, Motrin, naproxen, Aleve, etc. which are all available over-the-counter.  Please just use Tylenol  at a dose of 500mg -650mg  once every 6 hours as needed for your aches, pains, fevers.

## 2024-06-09 NOTE — ED Triage Notes (Signed)
 Pt c/o n/v x 2 day-states she had a questionable +preg test-LMP 6/30-NAD-steady gait

## 2024-06-09 NOTE — ED Provider Notes (Signed)
 [facility]  Note:  This document was prepared using Conservation officer, historic buildings and may include unintentional dictation errors.  MRN: [id number] DOB: [birth year]  Subjective:   Cassandra Wade is a 43 y.o. female presenting for 2-day history of nausea, vomiting, belly bloating.  She did a pregnancy test at home and thinks it may have been positive.  This would be an unplanned pregnancy.  LMP was 04/19/2024.  Denies fever, abdominal pain, pelvic pain, rashes, dysuria, urinary frequency, hematuria, vaginal discharge, vaginal bleeding.    No current facility-administered medications for this encounter.  Current Outpatient Medications:    fluticasone  (FLONASE ) 50 MCG/ACT nasal spray, Place 2 sprays into both nostrils daily., Disp: 18.2 mL, Rfl: 1   ondansetron  (ZOFRAN ) 4 MG tablet, Take 1 tablet (4 mg total) by mouth every 8 (eight) hours as needed for nausea or vomiting., Disp: 12 tablet, Rfl: 0   No Known Allergies  Past Medical History:  Diagnosis Date   Asthma    Bronchitis    HSV-2 (herpes simplex virus 2) infection      Past Surgical History:  Procedure Laterality Date   CESAREAN SECTION      No family history on file.  Social History   Tobacco Use   Smoking status: Former    Current packs/day: 0.50    Types: Cigarettes   Smokeless tobacco: Never  Vaping Use   Vaping status: Never Used  Substance Use Topics   Alcohol use: No   Drug use: No    ROS   Objective:   Vitals: BP 106/70 (BP Location: Left Arm)   Pulse 72   Temp 98.3 F (36.8 C) (Oral)   Resp 20   LMP 04/19/2024   SpO2 98%   Physical Exam Constitutional:      General: She is not in acute distress.    Appearance: Normal appearance. She is well-developed. She is not ill-appearing, toxic-appearing or diaphoretic.  HENT:     Head: Normocephalic and atraumatic.     Nose: Nose normal.     Mouth/Throat:     Mouth: Mucous membranes are moist.  Eyes:      General: No scleral icterus.       Right eye: No discharge.        Left eye: No discharge.     Extraocular Movements: Extraocular movements intact.  Cardiovascular:     Rate and Rhythm: Normal rate.  Pulmonary:     Effort: Pulmonary effort is normal.  Skin:    General: Skin is warm and dry.  Neurological:     General: No focal deficit present.     Mental Status: She is alert and oriented to person, place, and time.  Psychiatric:        Mood and Affect: Mood normal.        Behavior: Behavior normal.     Results for orders placed or performed during the hospital encounter of 06/09/24 (from the past 24 hours)  POCT urine pregnancy     Status: Abnormal   Collection Time: 06/09/24  7:32 PM  Result Value Ref Range   Preg Test, Ur Positive (A) Negative  POCT URINE DIPSTICK     Status: Abnormal   Collection Time: 06/09/24  7:32 PM  Result Value Ref Range   Color, UA yellow yellow   Clarity, UA cloudy (A) clear   Glucose, UA negative negative mg/dL   Bilirubin, UA negative negative   Ketones, POC UA small (15) (A)  negative mg/dL   Spec Grav, UA 8.979 8.989 - 1.025   Blood, UA trace-intact (A) negative   pH, UA 7.0 5.0 - 8.0   POC PROTEIN,UA =30 (A) negative, trace   Urobilinogen, UA 1.0 0.2 or 1.0 E.U./dL   Nitrite, UA Negative Negative   Leukocytes, UA Small (1+) (A) Negative    Assessment and Plan :   PDMP not reviewed this encounter.  1. Nausea and vomiting, unspecified vomiting type   2. Hematuria, unspecified type   3. Positive pregnancy test    Recommended Diclegis .  Will run and urine culture.  Anticipatory guidance provided to patient regarding prenatal care.  Recommended starting a prenatal vitamin and counseled on avoiding alcohol, cigarettes, drug use, caffeine.  Also counseled on medications that are safe in pregnancy over-the-counter.  Follow-up with her OB/GYN as soon as possible.  Counseled patient on potential for adverse effects with medications  prescribed/recommended today, ER and return-to-clinic precautions discussed, patient verbalized understanding.    Christopher Savannah, [place] 06/09/24 1945

## 2024-07-07 ENCOUNTER — Encounter

## 2024-09-02 ENCOUNTER — Other Ambulatory Visit: Payer: Self-pay

## 2024-09-02 ENCOUNTER — Encounter (HOSPITAL_BASED_OUTPATIENT_CLINIC_OR_DEPARTMENT_OTHER): Payer: Self-pay | Admitting: Emergency Medicine

## 2024-09-02 ENCOUNTER — Emergency Department (HOSPITAL_BASED_OUTPATIENT_CLINIC_OR_DEPARTMENT_OTHER)
Admission: EM | Admit: 2024-09-02 | Discharge: 2024-09-02 | Disposition: A | Discharge status: Home / Self Care | Attending: Emergency Medicine | Admitting: Emergency Medicine

## 2024-09-02 ENCOUNTER — Emergency Department (HOSPITAL_BASED_OUTPATIENT_CLINIC_OR_DEPARTMENT_OTHER)

## 2024-09-02 DIAGNOSIS — R1110 Vomiting, unspecified: Secondary | ICD-10-CM | POA: Diagnosis not present

## 2024-09-02 DIAGNOSIS — Z87891 Personal history of nicotine dependence: Secondary | ICD-10-CM | POA: Diagnosis not present

## 2024-09-02 DIAGNOSIS — R1030 Lower abdominal pain, unspecified: Secondary | ICD-10-CM | POA: Insufficient documentation

## 2024-09-02 DIAGNOSIS — D72829 Elevated white blood cell count, unspecified: Secondary | ICD-10-CM | POA: Diagnosis not present

## 2024-09-02 DIAGNOSIS — R109 Unspecified abdominal pain: Secondary | ICD-10-CM

## 2024-09-02 LAB — COMPREHENSIVE METABOLIC PANEL WITH GFR
ALT: 20 U/L (ref 0–44)
AST: 25 U/L (ref 15–41)
Albumin: 3.8 g/dL (ref 3.5–5.0)
Alkaline Phosphatase: 47 U/L (ref 38–126)
Anion gap: 13 (ref 5–15)
BUN: 6 mg/dL (ref 6–20)
CO2: 21 mmol/L — ABNORMAL LOW (ref 22–32)
Calcium: 9.6 mg/dL (ref 8.9–10.3)
Chloride: 100 mmol/L (ref 98–111)
Creatinine, Ser: 0.56 mg/dL (ref 0.44–1.00)
GFR, Estimated: >60 mL/min (ref >60)
Glucose, Bld: 77 mg/dL (ref 70–99)
Potassium: 4 mmol/L (ref 3.5–5.1)
Sodium: 135 mmol/L (ref 135–145)
Total Bilirubin: 0.4 mg/dL (ref 0.0–1.2)
Total Protein: 7.1 g/dL (ref 6.5–8.1)

## 2024-09-02 LAB — CBC WITH DIFFERENTIAL/PLATELET
Abs Immature Granulocytes: 0.17 K/uL — ABNORMAL HIGH (ref 0.00–0.07)
Basophils Absolute: 0 K/uL (ref 0.0–0.1)
Basophils Relative: 0 %
Eosinophils Absolute: 0.2 K/uL (ref 0.0–0.5)
Eosinophils Relative: 1 %
HCT: 35.7 % — ABNORMAL LOW (ref 36.0–46.0)
Hemoglobin: 12 g/dL (ref 12.0–15.0)
Immature Granulocytes: 1 %
Lymphocytes Relative: 17 %
Lymphs Abs: 2.2 K/uL (ref 0.7–4.0)
MCH: 32.4 pg (ref 26.0–34.0)
MCHC: 33.6 g/dL (ref 30.0–36.0)
MCV: 96.5 fL (ref 80.0–100.0)
Monocytes Absolute: 1.4 K/uL — ABNORMAL HIGH (ref 0.1–1.0)
Monocytes Relative: 10 %
Neutro Abs: 9.3 K/uL — ABNORMAL HIGH (ref 1.7–7.7)
Neutrophils Relative %: 71 %
Platelets: 238 K/uL (ref 150–400)
RBC: 3.7 MIL/uL — ABNORMAL LOW (ref 3.87–5.11)
RDW: 12.4 % (ref 11.5–15.5)
WBC: 13.3 K/uL — ABNORMAL HIGH (ref 4.0–10.5)
nRBC: 0 % (ref 0.0–0.2)

## 2024-09-02 LAB — HCG, SERUM, QUALITATIVE: Preg, Serum: POSITIVE — AB

## 2024-09-02 LAB — URINALYSIS, ROUTINE W REFLEX MICROSCOPIC
Bilirubin Urine: NEGATIVE
Glucose, UA: NEGATIVE mg/dL
Hgb urine dipstick: NEGATIVE
Ketones, ur: NEGATIVE mg/dL
Nitrite: NEGATIVE
Protein, ur: NEGATIVE mg/dL
Specific Gravity, Urine: 1.025 (ref 1.005–1.030)
pH: 7 (ref 5.0–8.0)

## 2024-09-02 LAB — HCG, QUANTITATIVE, PREGNANCY: hCG, Beta Chain, Quant, S: 22819 m[IU]/mL — ABNORMAL HIGH (ref <5)

## 2024-09-02 LAB — URINALYSIS, MICROSCOPIC (REFLEX)

## 2024-09-02 LAB — LIPASE, BLOOD: Lipase: 16 U/L (ref 11–51)

## 2024-09-02 NOTE — Discharge Instructions (Signed)
 Continue Tylenol  as needed for pain.  Follow-up with your OB/GYN as scheduled.  Return to the emergency department if you experience vaginal bleeding, leakage of fluids, or if you develop a fever or worsening of your current symptoms.  I have provided you with the contact information for the maternal assessment unit, you may be evaluated here at any point during your pregnancy if any pregnancy-related complications arise.

## 2024-09-02 NOTE — ED Notes (Signed)
 Unable to provide a urine sample at this time will try again at a later time.

## 2024-09-02 NOTE — Telephone Encounter (Signed)
 Pt 18wks 3d. Phone call from pt. Pt identified. Pt c/o constant, lower abd pain 10/10 since this approx 3:00am to the point where pt cries. Denies vag bleeding, watery leakage, or fever. Pt advised to go to ED now for eval. Pt voices understanding.

## 2024-09-02 NOTE — ED Notes (Signed)
 Pt requesting water, Erin PA notified to see if pt is able to drink

## 2024-09-02 NOTE — ED Notes (Signed)
 US  at bedside.

## 2024-09-02 NOTE — ED Notes (Signed)
 Fetal heart tones 145bpm per US  technician at bedside

## 2024-09-02 NOTE — ED Triage Notes (Addendum)
 Lower abd pain since 3 am some vomiting states 18 week ob pt  [facility] January 31 2025,  hurts lower abd tto walk denies any leaking fluid P6 L3 A1 supposed to deliver at [facility]

## 2024-09-02 NOTE — ED Provider Notes (Signed)
 [place] [facility] Provider Note   CSN: [id number] Arrival date & time: 09/02/24  1302     Patient presents with: Abdominal Pain   Cassandra Wade is a 43 y.o. female.   43 year old female presenting with abdominal pain. Patient reports lower abdominal pain that woke her up around 3am, states pain is sharp and waxes/wanes, worse with standing/walking, states she has to bend over while walking due to the pain. Endorses some vomiting this morning shortly after waking, which has been normal for her during this pregnancy. History of 2 vaginal births, 1 C-section, no history of pregnancy complications. Denies nausea currently, leakage of fluids, urinary symptoms, vaginal bleeding, vaginal discharge/burning/itching, chest pain, shortness of breath, trauma/falls.   Abdominal Pain      Prior to Admission medications   Medication Sig Start Date End Date Taking? Authorizing Provider  Doxylamine -Pyridoxine  (DICLEGIS ) 10-10 MG TBEC Take 1 tablet by mouth 2 (two) times daily. 06/09/24   Christopher Savannah, PA-C  fluticasone  (FLONASE ) 50 MCG/ACT nasal spray Place 2 sprays into both nostrils daily. 11/13/23   Keith, Kayla N, PA-C  ondansetron  (ZOFRAN ) 4 MG tablet Take 1 tablet (4 mg total) by mouth every 8 (eight) hours as needed for nausea or vomiting. 11/13/23   Keith, Kayla N, PA-C    Allergies: Patient has no known allergies.    Review of Systems  Gastrointestinal:  Positive for abdominal pain.    Updated Vital Signs  Vitals:   09/02/24 1505 09/02/24 1530 09/02/24 1558 09/02/24 1630  BP: 104/86 103/61 110/60 99/64  Pulse: 64 64 65 71  Resp: 17 17 16 16   Temp:      TempSrc:      SpO2: 100% 100% 100% 100%  Weight:      Height:         Physical Exam Vitals and nursing note reviewed.  Constitutional:      General: She is not in acute distress.    Appearance: Normal appearance. She is not ill-appearing or toxic-appearing.  HENT:     Head:  Normocephalic.  Eyes:     Extraocular Movements: Extraocular movements intact.  Cardiovascular:     Rate and Rhythm: Normal rate.  Pulmonary:     Effort: Pulmonary effort is normal.  Abdominal:     Palpations: Abdomen is soft.     Tenderness: There is abdominal tenderness (suprpaubic region). There is no guarding.     Comments: Fetal heart tones obtained with doppler ~140bpm  Musculoskeletal:     Cervical back: Normal range of motion.     Right lower leg: No edema.     Left lower leg: No edema.     Comments: Moves all extremities spontaneously without difficulty   Skin:    General: Skin is warm and dry.  Neurological:     Mental Status: She is alert and oriented to person, place, and time.     (all labs ordered are listed, but only abnormal results are displayed) Labs Reviewed  COMPREHENSIVE METABOLIC PANEL WITH GFR - Abnormal; Notable for the following components:      Result Value   CO2 21 (*)    All other components within normal limits  CBC WITH DIFFERENTIAL/PLATELET - Abnormal; Notable for the following components:   WBC 13.3 (*)    RBC 3.70 (*)    HCT 35.7 (*)    Neutro Abs 9.3 (*)    Monocytes Absolute 1.4 (*)    Abs Immature Granulocytes  0.17 (*)    All other components within normal limits  URINALYSIS, ROUTINE W REFLEX MICROSCOPIC - Abnormal; Notable for the following components:   Leukocytes,Ua SMALL (*)    All other components within normal limits  HCG, SERUM, QUALITATIVE - Abnormal; Notable for the following components:   Preg, Serum POSITIVE (*)    All other components within normal limits  URINALYSIS, MICROSCOPIC (REFLEX) - Abnormal; Notable for the following components:   Bacteria, UA FEW (*)    All other components within normal limits  URINE CULTURE  LIPASE, BLOOD  HCG, QUANTITATIVE, PREGNANCY    EKG: None  Radiology: US  OB Limited > 14 wks Result Date: 09/02/2024 CLINICAL DATA:  Suprapubic abdominal pain. EXAM: LIMITED OBSTETRIC ULTRASOUND  COMPARISON:  None Available. FINDINGS: Number of Fetuses: 1 Heart Rate:  145 bpm Movement: Yes Presentation: Breech Placental Location: Anterior Previa: No Amniotic Fluid (Subjective):  Within normal limits. BPD: 4.09 cm 18 w  3 d MATERNAL FINDINGS: Cervix:  Appears closed. Uterus/Adnexae: No abnormality visualized. IMPRESSION: 1. Single live intrauterine gestation in breech position measuring 18 weeks 3 days. No acute abnormality. This exam is performed on an emergent basis and does not comprehensively evaluate fetal size, dating, or anatomy; follow-up complete OB US  should be considered if further fetal assessment is warranted. Electronically Signed   By: Greig Pique M.D.   On: 09/02/2024 16:16     Procedures   Medications Ordered in the ED - No data to display                                  Medical Decision Making This patient presents to the ED for concern of abdominal pain, this involves an extensive number of treatment options, and is a complaint that carries with it a high risk of complications and morbidity.  The differential diagnosis includes preterm labor, missed versus threatened versus inevitable abortion, appendicitis, cholecystitis, pancreatitis   Co morbidities that complicate the patient evaluation  [redacted] weeks gestation   Additional history obtained:  Additional history obtained from record review External records from outside source obtained and reviewed including recent OB/GYN note   Lab Tests:  I Ordered, and personally interpreted labs.  The pertinent results include: CBC notable for leukocytosis with white blood cell count of 13.3, otherwise unremarkable. CMP unremarkable.  Lipase within normal limits.  Serum hCG positive. Urinalysis notable for small leukocytes with few bacteria on microscopy, low suspicion for UTI however will send for urine culture.    Imaging Studies ordered:  I ordered imaging studies including ultrasound > [redacted]wks gestation I  independently visualized and interpreted imaging which showed 1. Single live intrauterine gestation in breech position measuring 18 weeks 3 days. No acute abnormality.  I agree with the radiologist interpretation   Cardiac Monitoring: / EKG:  The patient was maintained on a cardiac monitor.  I personally viewed and interpreted the cardiac monitored which showed an underlying rhythm of: NSR   Consultations Obtained:  I requested consultation with the OB/GYN on-call at her OB/GYN office,  and discussed lab and imaging findings as well as pertinent plan - they recommend: I spoke with a triage nurse who relayed information from the patient's OB/GYN, Dr. Hedwig, Dr. Hedwig advises that the patient does not need to be transferred to a separate facility for continued monitoring at this stage in her pregnancy, given that fetal heart tones were detected on Doppler and on ultrasound imaging,  patient can continue with Tylenol  as needed for pain and keep her scheduled OB/GYN appointment, recommend discussing return precautions including vaginal bleeding and development of a fever with the patient.   Problem List / ED Course / Critical interventions / Medication management I have reviewed the patients home medicines and have made adjustments as needed   Social Determinants of Health:  Former tobacco use   Test / Admission - Considered:  Physical exam is notable as above, patient's abdomen is soft and tender in the suprapubic region, able to obtain fetal heart tones on Doppler at about 140 bpm.  Labs are notable for mild leukocytosis, she is afebrile and without tachycardia.  Ultrasound is reassuring, notable as above, low suspicion for preterm labor or fetal demise at this time.  Patient's abdominal tenderness on exam is generalized in the suprapubic region and not greater on the left side versus the right, low suspicion for conditions like acute appendicitis cholecystitis.  I called and spoke with the  OB/GYN on-call at this patient's OB/GYN office, see above.  I recommend that the patient continue Tylenol  as needed for pain, follow-up with her OB/GYN as previously scheduled, strict return precautions discussed including if she develops a fever or has vaginal bleeding/leakage of fluids or worsening abdominal pain.  She voiced understanding and is in agreement with this plan, she is appropriate for discharge at this time.    Amount and/or Complexity of Data Reviewed Labs: ordered. Radiology: ordered.        Final diagnoses:  Abdominal pain, unspecified abdominal location    ED Discharge Orders     None          Glendia Rocky SAILOR, [place] 09/02/24 1712    Lenor Hollering, MD 09/02/24 [phone]

## 2024-09-06 LAB — URINE CULTURE: Culture: 10000 — AB

## 2024-09-07 ENCOUNTER — Telehealth (HOSPITAL_BASED_OUTPATIENT_CLINIC_OR_DEPARTMENT_OTHER): Payer: Self-pay | Admitting: *Deleted

## 2024-09-07 NOTE — Progress Notes (Signed)
 ED Antimicrobial Stewardship Positive Culture Follow Up   Cassandra Wade is an 43 y.o. female who presented to [facility] on 09/02/2024 with a chief complaint of vomiting.  Chief Complaint  Patient presents with   Abdominal Pain    Recent Results (from the past 720 hours)  Urine Culture     Status: Abnormal   Collection Time: 09/02/24  4:31 PM   Specimen: Urine, Clean Catch  Result Value Ref Range Status   Specimen Description   Final    URINE, CLEAN CATCH Performed at [facility], [address] [place], [place] [address]    Special Requests   Final    NONE Performed at [facility], [address] [place], [place] [address]    Culture 10,000 COLONIES/mL ENTEROCOCCUS FAECALIS (A)  Final   Report Status 09/06/2024 FINAL  Final   Organism ID, Bacteria ENTEROCOCCUS FAECALIS (A)  Final      Susceptibility   Enterococcus faecalis - MIC*    AMPICILLIN <=2 SENSITIVE Sensitive     NITROFURANTOIN <=16 SENSITIVE Sensitive     VANCOMYCIN 2 SENSITIVE Sensitive     * 10,000 COLONIES/mL ENTEROCOCCUS FAECALIS    UA with small leukocytes with few bacteria. Sent for culture. Returned positive for 10,000 CFU/mL. Since it is not Group B Strep and CFU/mL count is not 100,000+, then does not meet criteria for asymptomatic bacteruria treatment.   No treatment indicated.   ED Provider: Rolan Quale, DO   [name] 09/07/2024, 10:12 AM Clinical Pharmacist Monday - Friday phone -  [phone] Saturday - Sunday phone - [phone]

## 2024-09-07 NOTE — Telephone Encounter (Signed)
 Post ED Visit - Positive Culture Follow-up  Culture report reviewed by antimicrobial stewardship pharmacist: [facility] [x]  [name], Pharm.D. []  Venetia Gully, Pharm.D., BCPS AQ-ID []  Garrel Crews, Pharm.D., BCPS []  Almarie Lunger, [address].D., BCPS []  [place], [address].D., BCPS, AAHIVP []  Rosaline Bihari, Pharm.D., BCPS, AAHIVP []  Vernell Meier, PharmD, BCPS []  Latanya Hint, PharmD, BCPS []  Donald Medley, PharmD, BCPS []  Rocky Bold, PharmD []  Dorothyann Alert, PharmD, BCPS []  Morene Babe, PharmD  [facility] []  Rosaline Edison, PharmD []  Romona Bliss, PharmD []  Dolphus Roller, PharmD []  Veva Seip, Rph []  Vernell Daunt) Leonce, PharmD []  Eva Allis, PharmD []  Rosaline Millet, PharmD []  Iantha Batch, PharmD []  Arvin Gauss, PharmD []  Wanda Hasting, PharmD []  Ronal Rav, PharmD []  Rocky Slade, PharmD []  Bard Jeans, PharmD   Positive urine culture No treatment needed per Rolan Quale, DO; no further patient follow-up is required at this time.  Lorita Barnie Pereyra 09/07/2024, 11:04 AM

## 2024-09-09 NOTE — Progress Notes (Signed)
 Briefly, Cassandra Wade [id number] at [redacted]w[redacted]d Estimated Date of Delivery: 01/31/25  Anatomic ultrasound today with appropriate growth.  Noted velamentous insertion.  Incomplete. Patient also expresses a desire for repeat C-section and tubal ligation.  BP 108/64   Wt 83.9 kg (185 lb)   LMP 04/19/2024 (Approximate)   BMI 34.96 kg/m  Fundal Height- not measured due to recent ultrasound Fetal Heart Tones- present     Problem List Items Addressed This Visit       Pregnancy 2026   Velamentous insertion of umbilical cord (CMD)   Overview   Noted at 20 weeks sono. Fetal surveillance per AMA protocol      Pregnancy with history of cesarean section, antepartum (CMD)   Overview   Previous C-section x 1 for herpetic outbreak. Prior SVD x 2 Discharge repeat C-section with tubal ligation. Case request done for 01/24/2025      Pregnancy (CMD) - Primary   Overview   Dating by 8wk u/s.  Estimated Date of Delivery: 01/31/25 Low risk NIPS, female Early 1 hour GTT: 106 20-week anatomic scan: anterior placenta, velamentous cord insertion, incomplete  Pregravid BMI 32. Delivery at age 43.       Relevant Orders   US  OB Follow Up Transabdominal Approach   Maternal obesity, antepartum (CMD)   Overview   Fetal surveillance per AMA protocol      Encounter for tubal ligation   Overview   Desires permanent elective sterility. Medicaid papers to be signed at 28 weeks. Anticipate performing at time of repeat C-section.      Encounter for other specified antenatal screening (CMD)   Overview   Diagnosis necessary for prenatal screening labs      Relevant Orders   US  OB Follow Up Transabdominal Approach   AMA (advanced maternal age) multigravida 35+ (CMD)   Overview   Plan monthly growth u/s.  20wk: 21%ile with AC 39% 24wk:  28wk 32wk 33wk BPP 34wk BPP 35wk BPP 36 wk  37wk BPP 38wk BPP Consider delivery at 39wks      Relevant Orders   US  OB Follow Up Transabdominal  Approach     Labor signs, pregnancy warning signs, and fetal movement counting reviewed (if applicable). All questions were answered.   Return in about 4 weeks (around 10/07/2024) for Prenatal visit, Repeat OB Ultrasound.

## 2024-10-13 ENCOUNTER — Encounter (HOSPITAL_COMMUNITY): Payer: Self-pay | Admitting: Obstetrics and Gynecology

## 2024-10-13 ENCOUNTER — Other Ambulatory Visit: Payer: Self-pay

## 2024-10-13 ENCOUNTER — Inpatient Hospital Stay (HOSPITAL_COMMUNITY)
Admission: AD | Admit: 2024-10-13 | Discharge: 2024-10-13 | Disposition: A | Discharge status: Home / Self Care | Attending: Obstetrics and Gynecology | Admitting: Obstetrics and Gynecology

## 2024-10-13 DIAGNOSIS — O365931 Maternal care for other known or suspected poor fetal growth, third trimester, fetus 1: Secondary | ICD-10-CM | POA: Diagnosis not present

## 2024-10-13 DIAGNOSIS — O3090 Multiple gestation, unspecified, unspecified trimester: Secondary | ICD-10-CM | POA: Insufficient documentation

## 2024-10-13 DIAGNOSIS — Z87891 Personal history of nicotine dependence: Secondary | ICD-10-CM | POA: Insufficient documentation

## 2024-10-13 DIAGNOSIS — O368121 Decreased fetal movements, second trimester, fetus 1: Secondary | ICD-10-CM | POA: Insufficient documentation

## 2024-10-13 DIAGNOSIS — Z3A24 24 weeks gestation of pregnancy: Secondary | ICD-10-CM | POA: Insufficient documentation

## 2024-10-13 NOTE — MAU Provider Note (Signed)
 " History     [id number]  Arrival date and time: 10/13/24 1620    Chief Complaint  Patient presents with   Decreased Fetal Movement     HPI Cassandra Wade is a 43 y.o. at [redacted]w[redacted]d who presents for DFM. She states she last felt movement at 1130 AM today. She saw MFM at [facility] today for genetic counseling due to FGR although she did not know how small the baby was.  She has been feeling movement sporadically but she called [facility] who told her to conduct fetal kick counts.   She states she tried it and did not get to 10 movements so she panicked and presented to MAU. Denies contractions, LOF, VB. She has had no problems with her previous pregnancies.      Past Medical History:  Diagnosis Date   Asthma    Bronchitis    HSV-2 (herpes simplex virus 2) infection     Past Surgical History:  Procedure Laterality Date   CESAREAN SECTION      No family history on file.  Social History   Socioeconomic History   Marital status: Single    Spouse name: Not on file   Number of children: Not on file   Years of education: Not on file   Highest education level: Not on file  Occupational History   Not on file  Tobacco Use   Smoking status: Former    Current packs/day: 0.50    Types: Cigarettes   Smokeless tobacco: Never  Vaping Use   Vaping status: Never Used  Substance and Sexual Activity   Alcohol use: No   Drug use: No   Sexual activity: Yes    Birth control/protection: None  Other Topics Concern   Not on file  Social History Narrative   Not on file   Social Drivers of Health   Tobacco Use: Medium Risk (10/07/2024)   Received from [facility]   Patient History    Smoking Tobacco Use: Former    Smokeless Tobacco Use: Never    Passive Exposure: Never  Physicist, Medical Strain: Not on file  Food Insecurity: Low Risk (09/07/2024)   Received from [facility]   Epic    Within the past 12 months, you worried that your food would run out before you got money to buy  more: Never true    Within the past 12 months, the food you bought just didn't last and you didn't have money to get more. : Never true  Transportation Needs: No Transportation Needs (09/07/2024)   Received from [facility]    In the past 12 months, has lack of reliable transportation kept you from medical appointments, meetings, work or from getting things needed for daily living? : No  Physical Activity: Not on file  Stress: Not on file  Social Connections: Not on file  Intimate Partner Violence: Not on file  Depression (EYV7-0): Not on file  Alcohol Screen: Not on file  Housing: Low Risk (09/07/2024)   Received from [facility]   Epic    What is your living situation today?: I have a steady place to live    Think about the place you live. Do you have problems with any of the following? Choose all that apply:: None/None on this list  Utilities: Low Risk (09/07/2024)   Received from [facility]   Utilities    In the past 12 months has the electric, gas, oil, or water company threatened to shut  off services in your home? : No  Health Literacy: Not on file    Allergies[1]  Medications Ordered Prior to Encounter[2]  Pertinent positives and negative per HPI, all others reviewed and negative  Physical Exam   BP 108/71 (BP Location: Right Arm)   Pulse 88   Temp 98.9 F (37.2 C) (Oral)   Resp 16   Ht 5' (1.524 m)   Wt 88.4 kg   LMP 04/19/2024   SpO2 98%   BMI 38.04 kg/m   Patient Vitals for the past 24 hrs:  BP Temp Temp src Pulse Resp SpO2 Height Weight  10/13/24 1644 108/71 98.9 F (37.2 C) Oral 88 16 98 % 5' (1.524 m) 88.4 kg    Physical Exam Vitals and nursing note reviewed.  Constitutional:      Appearance: She is well-developed.  HENT:     Head: Normocephalic and atraumatic.     Mouth/Throat:     Mouth: Mucous membranes are moist.  Eyes:     Extraocular Movements: Extraocular movements intact.  Cardiovascular:     Rate and Rhythm:  Normal rate and regular rhythm.  Pulmonary:     Effort: Pulmonary effort is normal.  Abdominal:     Palpations: Abdomen is soft.     Tenderness: There is no abdominal tenderness.  Skin:    Capillary Refill: Capillary refill takes less than 2 seconds.  Neurological:     General: No focal deficit present.     Mental Status: She is alert.     FHT Baseline: 140 bpm Variability: Good {> 6 bpm) Accelerations: Reactive, ocassional 10x10s Decelerations: Absent Uterine activity: None  Labs No results found for this or any previous visit (from the past 24 hours).  Imaging No results found.  MAU Course  Procedures  Lab Orders         Urinalysis, Routine w reflex microscopic -Urine, Clean Catch    No orders of the defined types were placed in this encounter.  Imaging Orders  No imaging studies ordered today    MDM Moderate (Level 3-4)  Assessment and Plan  Decreased fetal movements in second trimester, fetus 1 of multiple gestation  [redacted] weeks gestation of pregnancy   Cassandra Wade is a 43 y.o. at [redacted]w[redacted]d who presents for DFM.   -Has felt sporadic movement while in the MAU. Denies contractions, VB, LOF. -EFM reassuring -Patient saw MFM for genetic counseling today due to IUGR but she did not know how small the baby was although she stated blood flow was normal. Provider was unable to find US  records for that visit.   -Stable for discharge home -Discussed fetal kick counts and their utility typically starting at 28 weeks. In the setting of IUGR baby <28 weeks, movements can be sporatic which is what she has been feeling. -She has MFM f/u US  at [facility] on 12/26. Encouraged to keep that appointment. -All questions answered, anticipatory guidance and detailed return precautions provided.    Sava Proby L Anthany Thornhill, MD/MHA 10/13/2024 5:35 PM  Allergies as of 10/13/2024   No Known Allergies      Medication List     STOP taking these medications     Doxylamine -Pyridoxine  10-10 MG Tbec Commonly known as: Diclegis    fluticasone  50 MCG/ACT nasal spray Commonly known as: FLONASE    ondansetron  4 MG tablet Commonly known as: ZOFRAN        TAKE these medications    PRENATAL 1 PO Take by mouth.           [  1] No Known Allergies [2]  No current facility-administered medications on file prior to encounter.   Current Outpatient Medications on File Prior to Encounter  Medication Sig Dispense Refill   Prenatal MV-Min-Fe Fum-FA-DHA (PRENATAL 1 PO) Take by mouth.     Doxylamine -Pyridoxine  (DICLEGIS ) 10-10 MG TBEC Take 1 tablet by mouth 2 (two) times daily. (Patient not taking: Reported on 10/13/2024) 60 tablet 0   fluticasone  (FLONASE ) 50 MCG/ACT nasal spray Place 2 sprays into both nostrils daily. (Patient not taking: Reported on 10/13/2024) 18.2 mL 1   ondansetron  (ZOFRAN ) 4 MG tablet Take 1 tablet (4 mg total) by mouth every 8 (eight) hours as needed for nausea or vomiting. (Patient not taking: Reported on 10/13/2024) 12 tablet 0   "

## 2024-10-13 NOTE — Telephone Encounter (Signed)
 Pt 24 wks 2d. Phone call from pt. Pt identified. Pt states,I was told by the provider to monitor my baby's movements, but was not told what to do. Pt informed should have 10 movement in 2 hrs. If this does not occur, pt to go to L&D for eval. Pt voices understanding.   Arne SQUIBB, MSN, RN

## 2024-10-13 NOTE — MAU Note (Signed)
 Cassandra Wade is a 43 y.o. at [redacted]w[redacted]d here in MAU reporting: decreased FM for last 2 days- had telehealth call with MFM today with [facility] about fetal growth.   LMP: - Onset of complaint: 2 days ago did not move at all- last movement today was 1130 Pain score: 0/10 Vitals:   10/13/24 1644  BP: 108/71  Pulse: 88  Resp: 16  Temp: 98.9 F (37.2 C)  SpO2: 98%     FHT:  154 Lab orders placed from triage: ua

## 2024-10-13 NOTE — MAU Note (Signed)
 Pt states states that she has not felt the baby move since 1130. No vaginal bleeding or contractions reported. Patient placed on EFM.  FHR 140. VEVA LITTIE POD, RN

## 2024-10-13 NOTE — Progress Notes (Signed)
 Genetic Counseling  Visit Summary Note  Appointment Date: 10/13/2024 Referred By: Alona Poe  Date of Birth: 1980/12/18  Pregnancy history: [id number] Estimated Date of Delivery: 01/31/25 Estimated Gestational Age: [redacted]w[redacted]d  Todays visit was completed via a real-time telehealth (see specific modality noted below). The patient/authorized person provided oral consent at the time of the visit to engage in a telemedicine encounter with the present provider at [facility] The patient/authorized person was informed of the potential benefits, limitations, and risks of telemedicine. The patient/authorized person expressed understanding that the laws that protect confidentiality also apply to telemedicine. The patient/authorized person acknowledged understanding that telemedicine does not provide emergency services and that he or she would need to call 911 or proceed to the nearest hospital for help if such a need arose.  Total time spent in the clinical discussion 47 minutes. Telehealth Modality: Modality: Video visit State of Patient Permanent Address: [place] Telehealth provided in patient's home  Cassandra Wade was seen for genetic counseling because of the ultrasound finding of fetal growth restriction.  In summary: Discussed ultrasound finding of FGR Reviewed possible etiologies Reviewed available testing and screening Amniocentesis - Declined Cell free DNA - Declined MaterniTGenome; may consider at later date Previously low risk Panorama NIPT Expanded carrier screening - Declned; may consider at later date Previously negative Horizon 14 Discussed family history No concerns reported  Ultrasound previously revealed fetal intrauterine growth restriction. We discussed this finding in detail. She was counseled that fetal growth restriction (FGR) affects up to 10% of all pregnancies.  Approximately 20% of cases of FGR have a chromosomal or congenital malformation associated.  Poor placental function accounts for another 25-30% of FGR.  In addition maternal factors including chronic hypertension, anemia, diabetes, smoking and cocaine use can contribute to fetal growth restriction. Cassandra Wade denied any medical concerns or exposure to these risk factors.  Thus, our discussion focused on the genetic etiologies.  Chromosome abnormalities are more frequent in pregnancies with structural anomalies and FGR,  structural fetal anomalies were not identified by ultrasound.  When only those fetuses with FGR and no structural malformations were considered, the mean rate of chromosomal abnormalities was 6.4%. We reviewed genes, chromosomes and common aneuploid conditions.  We also discussed structural rearrangements including deletions and duplications.  Cassandra Wade has had normal cfDNA screening for common aneuploidies (13, 18, 21, X and Y).  We discussed the option of additional noninvasive prenatal screening /cell free DNA screening. Specifically we discussed the option of MaterniTGenome through [employer]. This screen assesses cell free DNA from each chromosome in maternal blood and can detect deletions or duplications that are greater than or equal to , in addition to select microdeletion syndromes ( 22q11, 15q11, 11q23, 8q24, 5p15, 4p16, and 1p36). The sensitivity of this screen for gains/losses of chromosome material greater than is reported to be >95%. We discussed limitations and benefits of this screen including that it is not diagnostic and cannot assess for all chromosome conditions nor assess for single gene conditions.   We also discussed diagnostic testing via amniocentesis.  We discussed the risks, benefits, and limitations of amniocentesis, including the approximate 1 in 1000 risk for complications, including spontaneous pregnancy loss. We reviewed that cells derived from amniocentesis can be used for standard karyotype analysis, microarray analysis and/or whole genome  sequencing. Following our discussion this patient declined additional testing or screening other than ultrasound at this time. She will wait until they are further along and see if the growth improves and then  will reconsider these options. She understands that either cfDNA screening or amniocentesis remain available at any time during the pregnancy.  As the FGR may be due to an underlying recessive condition, we discussed recessive conditions and expanded carrier screening.  We discussed that each person is estimated to have 7-10 genes that do not work correctly, meaning that each person is estimated to be a carrier of 7-10 different recessive or X-linked conditions. She was counseled that the majority of these conditions follow autosomal recessive inheritance. We reviewed that we each have two copies of all of our genes, one inherited from each parent. If one copy of the pair of genes is changed in a way that causes it not to function properly, but the other copy of the gene works, then a person is called a carrier for that condition. However, because the other copy of the gene works correctly, the person typically does not have any health problems related to the non-working gene(s). It is only when BOTH copies of the pair of genes do not work correctly that a person will have the condition and the related health problems.   We reviewed that an expanded carrier screening panel evaluates carrier status for a wide range of genetic conditions. Some of these conditions are severe and actionable, but also rare; others occur more commonly, but are less severe. We discussed that the panel we utilize includes approximately 800 autosomal or X-linked genetic conditions. We reviewed that the prevalence of each condition varies (and often varies with ethnicity). Thus the background chance to be a carrier for each of these various conditions would range, and in some cases be very low or unknown. Similarly, the detection  rate varies with each condition and also varies in some cases with ethnicity. We reviewed that a negative carrier screen would reduce, but not eliminate the chance to be a carrier for these conditions. We reviewed that in the event that one partner is found to be a carrier for one or more conditions, carrier screening would be available to the partner for those conditions.  We discussed that some couples choose to have one partner tested first and others choose to test both members of the couple at the same time. Finally, we discussed that while typically there are only reproductive implications to a positive screen, we may diagnose an asymptomatic affected individual with two mutations.  Additionally, premutation carriers for Fragile X syndrome may have an increased risk for premature ovarian failure and/or fragile X associated tremor and ataxia syndrome.  Carrier status for some conditions may suggest an increased risk for a different condition including cancer.  We discussed the possible results that the tests might provide including: positive, negative or unanticipated/unclear.  She understands that in some cases a parent is identified as a carrier for a condition that is unrelated to the ultrasound findings. Finally, she was counseled regarding the cost of the screening options and potential out of pocket expenses.  After consideration of all the options, she declined carrier screening at this time.  She may elect to have it performed at a later time.  She was counseled that the prognosis for the growth restriction is highly dependent how it changes with time.  She understands that, as the fetus develops, this may become more or less significant.  She will return for follow up ultrasound at our office and we will readdress these findings at that time.  Both family histories were reviewed and found to be noncontributory for birth  defects, intellectual disability, and known genetic conditions. Without further  information regarding the provided family history, an accurate genetic risk cannot be calculated. Further genetic counseling is warranted if more information is obtained.  Cassandra Wade denied exposure to environmental toxins or chemical agents. She denied the use of alcohol, tobacco or street drugs. She denied significant viral illnesses during the course of her pregnancy. Her medical and surgical histories were noncontributory.   I counseled this patient regarding the above risks and available options. I spent 59 minutes on the appointment date reviewing available medical records, assessing genetic risk, obtaining a structured family genetic history, providing genetic counseling services to the patient and/or family members, coordinating care and testing, and documenting clinical information in the electronic record.    Dena Chancy, MS [facility] Certified Genetic Counselor
